# Patient Record
Sex: Female | Born: 1990 | Race: Black or African American | Hispanic: No | Marital: Single
Health system: Midwestern US, Community
[De-identification: ages and names within clinical notes are randomized; demographics above are authoritative.]

## PROBLEM LIST (undated history)

## (undated) DIAGNOSIS — N643 Galactorrhea not associated with childbirth: Secondary | ICD-10-CM

---

## 2015-07-02 IMAGING — US US NON OB TRANSVAGINAL W LTD TA
1 series · 14 of 28 positions shown · non-contrast
Comparison: 07/15/2008, which was an OB study.

HISTORY: Pelvic pain. 24-year-old. LMP 09/25/2014.
TECHNIQUE: Pelvic ultrasound. This study is performed transvaginal and transabdominal.

[Series 1: us non ob transvaginal w ltd ta · 14 of 55 slices shown]
[im 3/55]
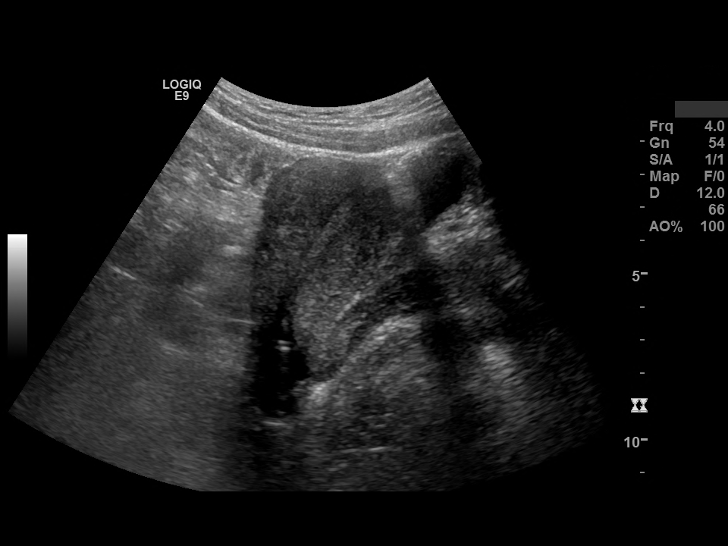
[im 7/55]
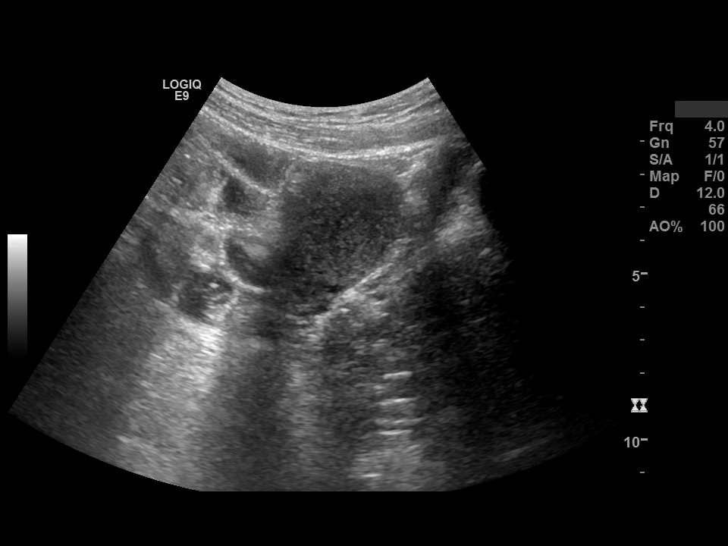
[im 11/55]
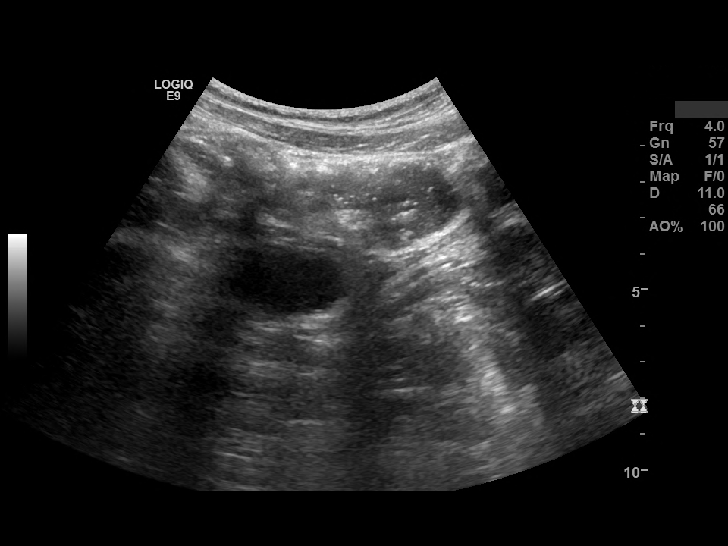
[im 15/55]
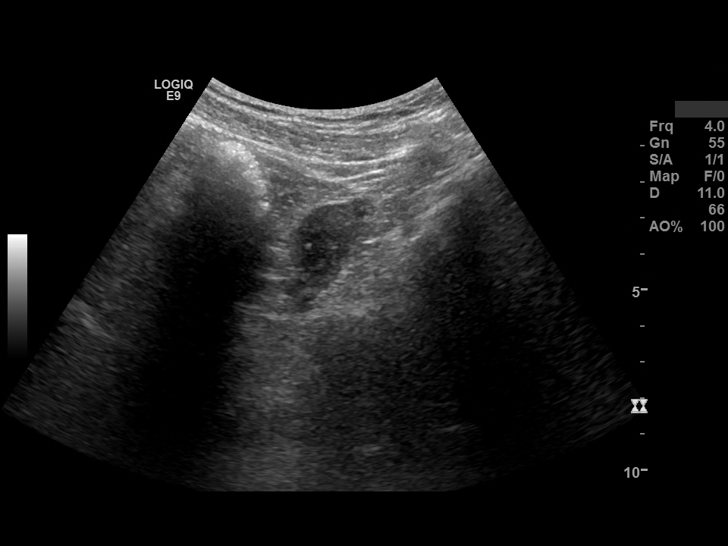
[im 19/55]
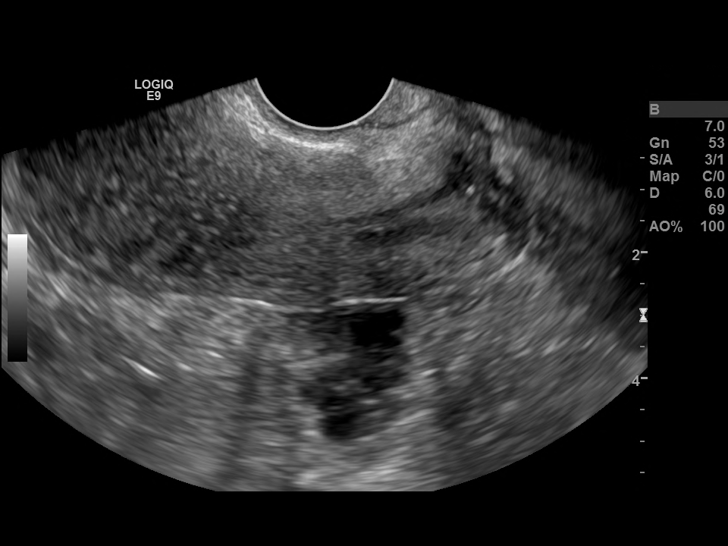
[im 23/55]
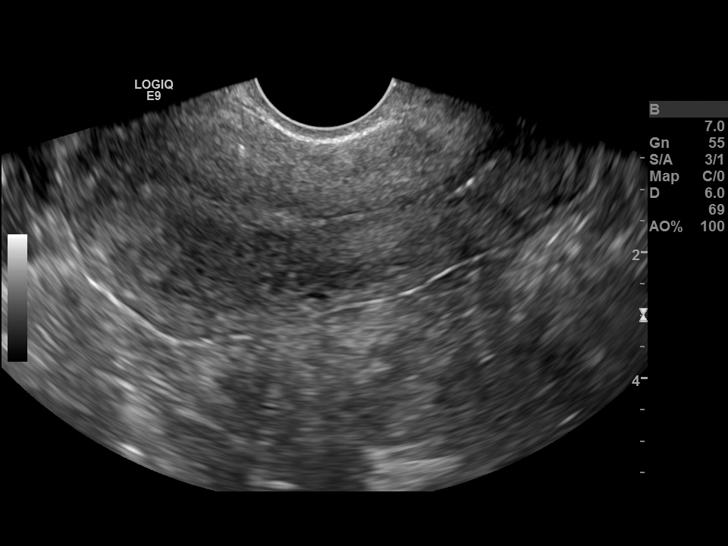
[im 27/55]
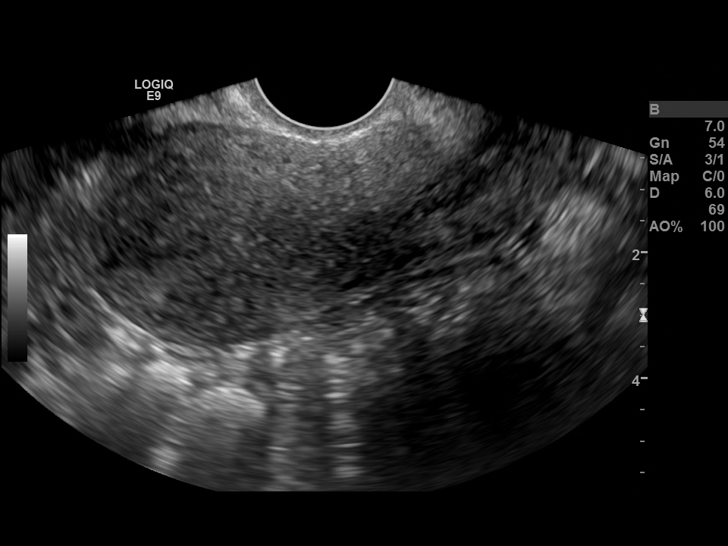
[im 31/55]
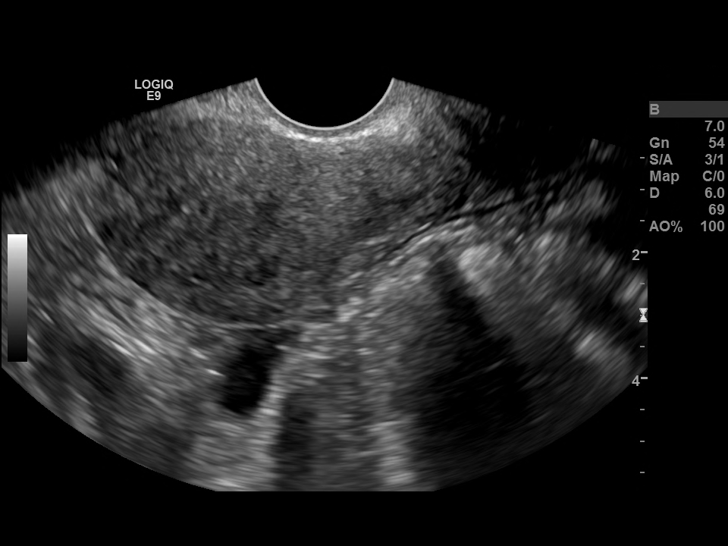
[im 35/55]
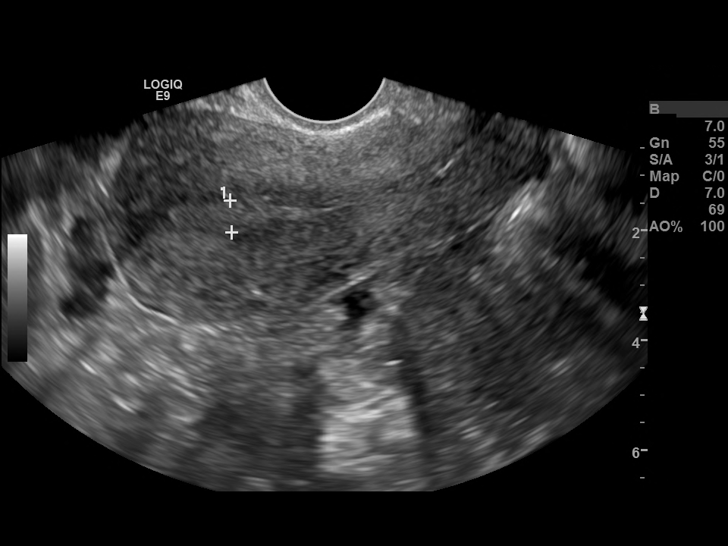
[im 39/55]
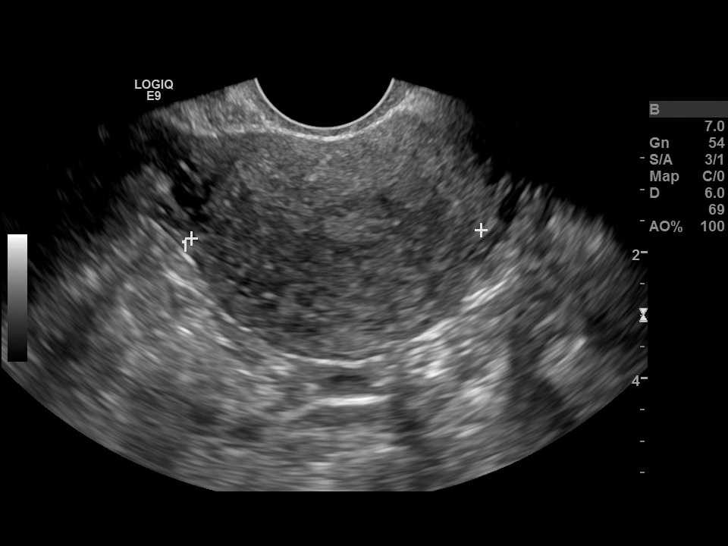
[im 43/55]
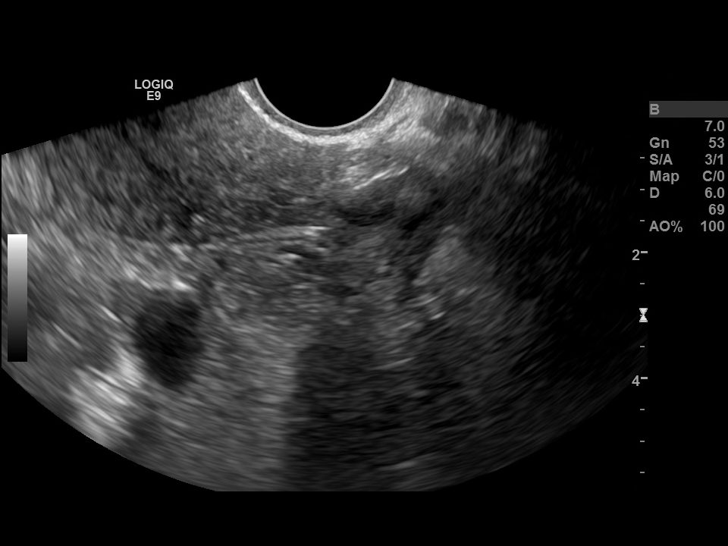
[im 47/55]
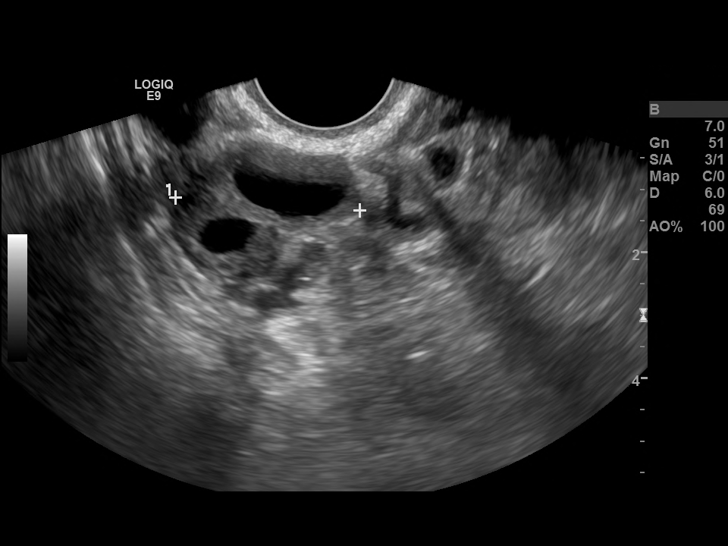
[im 51/55]
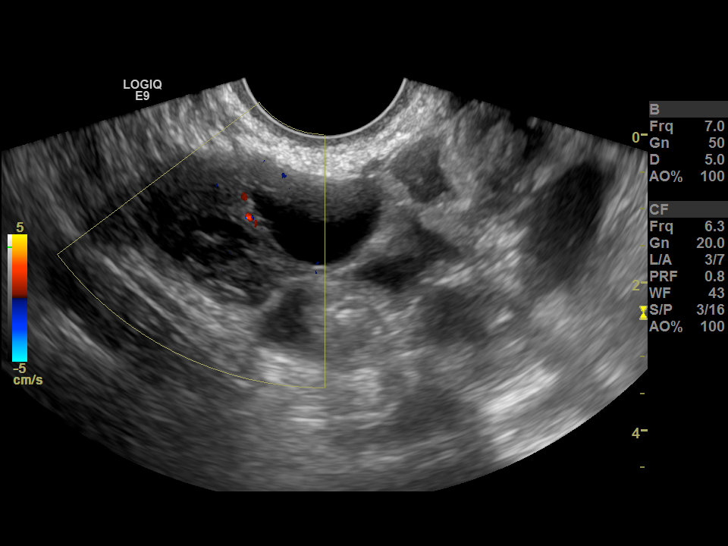
[im 55/55]
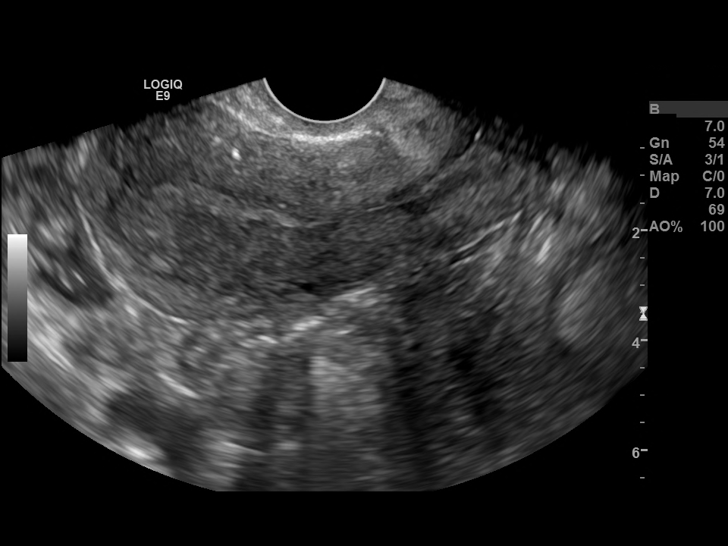

[14 of 28 positions shown; findings below may reference images not displayed]

FINDINGS: The uterus measures 82 x 38 x 46 mm. There is a 6 mm thick endometrium. No uterine lesions such as fibroids are seen.

Right ovary is 36 x 22 x 29 mm with a follicular cyst of 19 x 11 x 19 mm.

Left ovary is 27 x 22 x 18 mm and appears normal. Follicles are seen bilaterally, as well.

No free fluid is seen.
IMPRESSION: 1. Normal appearing uterus and endometrium.

2. Follicular cysts on the right, several small follicles bilaterally.

## 2023-02-15 ENCOUNTER — Inpatient Hospital Stay
Admit: 2023-02-15 | Discharge: 2023-02-16 | Disposition: A | Discharge status: Home / Self Care | Payer: PRIVATE HEALTH INSURANCE | Attending: Emergency Medicine

## 2023-02-15 DIAGNOSIS — L301 Dyshidrosis [pompholyx]: Secondary | ICD-10-CM

## 2023-02-15 NOTE — ED Provider Notes (Signed)
EMERGENCY DEPARTMENT HISTORY AND PHYSICAL EXAM    Date: 02/15/2023  Patient Name: Gina Becker    History of Presenting Illness     Chief Complaint   Patient presents with    Rash         History Provided By: patient     Chief Complaint: rash   Duration: 2 weeks  Timing:  acute  Location: hands feet ankles   Quality: itchy   Severity:moderate to severe  Modifying Factors: none   Associated Symptoms: none      Additional History (Context): Gina Becker is a 32 y.o. female with no documented PMH who presents with c/o a rash to the hands, arms, ankles and legs bilaterally. Pt has a hx of eczema which she states she typically treats wit eucerin cream. Pt states she has been using her normal hygiene regimen with no improvement in her sx. Pt denies any known trigger aside from stress. Denies prodromal sx and no one at home has a rash besides her. No other complaints reported at this time.     PCP: No primary care provider on file.    No current facility-administered medications for this encounter.     Current Outpatient Medications   Medication Sig Dispense Refill    predniSONE 5 MG (21) TBPK Use as directed 21 each 0    triamcinolone (KENALOG) 0.1 % cream Apply topically 2 times daily. 453.6 g 0       Past History     Past Medical History:  No past medical history on file.    Past Surgical History:  No past surgical history on file.    Family History:  No family history on file.    Social History:       Allergies:  No Known Allergies      Review of Systems   Review of Systems   Constitutional:  Negative for chills and fever.   HENT:  Negative for congestion, rhinorrhea and sore throat.    Eyes:  Negative for discharge and redness.   Respiratory:  Negative for cough, shortness of breath, wheezing and stridor.    Cardiovascular:  Negative for chest pain.   Gastrointestinal:  Negative for abdominal pain, nausea and vomiting.   Genitourinary:  Negative for dysuria and frequency.   Musculoskeletal:  Negative for back pain  and neck pain.   Skin:  Positive for rash. Negative for wound.   Neurological:  Negative for seizures, syncope and headaches.   All other systems reviewed and are negative.    All Other Systems Negative  Physical Exam     Vitals:    02/15/23 2008   BP: 117/81   Pulse: 76   Resp: 18   Temp: 97.9 F (36.6 C)   SpO2: 98%   Weight: 99.8 kg (220 lb)   Height: 1.727 m (5\' 8" )     Physical Exam  Vitals and nursing note reviewed.   Constitutional:       General: She is not in acute distress.     Appearance: Normal appearance. She is not ill-appearing, toxic-appearing or diaphoretic.   HENT:      Head: Normocephalic and atraumatic.      Mouth/Throat:      Mouth: Mucous membranes are moist.   Eyes:      General: No scleral icterus.        Right eye: No discharge.         Left eye: No discharge.  Conjunctiva/sclera: Conjunctivae normal.   Cardiovascular:      Rate and Rhythm: Normal rate.   Pulmonary:      Effort: Pulmonary effort is normal. No respiratory distress.      Breath sounds: No stridor.   Musculoskeletal:         General: No deformity or signs of injury. Normal range of motion.      Cervical back: Normal range of motion and neck supple.   Skin:     General: Skin is warm and dry.      Findings: Rash present.      Comments: Scaling papular rash with skin peeling and scabbing noted to the hands, both dorsal and palmar surfaces, arms, ankles, and legs. No excoriation to the web spaces of the fingers.    Neurological:      General: No focal deficit present.      Mental Status: She is alert and oriented to person, place, and time. Mental status is at baseline.      Motor: No weakness.      Coordination: Coordination normal.      Comments: Gait is steady and patient exhibits no evidence of ataxia. Patient is able to ambulate without difficulty. No focal neurological deficit noted. No facial droop, slurred speech, or evidence of altered mentation noted on exam.     Psychiatric:         Mood and Affect: Mood normal.          Behavior: Behavior normal.         Thought Content: Thought content normal.                Diagnostic Study Results     Labs -   No results found for this or any previous visit (from the past 12 hour(s)).    Radiologic Studies -   No orders to display     @CT48 @  @CXR48 @      Medical Decision Making   I am the first provider for this patient.    I reviewed the vital signs, available nursing notes, past medical history, past surgical history, family history and social history.    Vital Signs-Reviewed the patient's vital signs.      Records Reviewed: Shara Hartis, PA-C     Procedures:  Procedures    Provider Notes (Medical Decision Making): Impression:  rash, dishydrotic eczema     Clinical presentation suggestive of dishydrotic eczema, will plan to d/c with topical triamcinolone and oral prednisone, with dermatology follow-up. Pt agrees. Damean Poffenberger J Kimbrely Buckel, PA-C      Dictation disclaimer:  Please note that this dictation was completed with Dragon, the computer voice recognition software.  Quite often unanticipated grammatical, syntax, homophones, and other interpretive errors are inadvertently transcribed by the computer software.  Please disregard these errors.  Please excuse any errors that have escaped final proofreading.      MED RECONCILIATION:  No current facility-administered medications for this encounter.     Current Outpatient Medications   Medication Sig    predniSONE 5 MG (21) TBPK Use as directed    triamcinolone (KENALOG) 0.1 % cream Apply topically 2 times daily.       Disposition:  D/c        Medication List        START taking these medications      predniSONE 5 MG (21) Tbpk  Use as directed     triamcinolone 0.1 % cream  Commonly known as: KENALOG  Apply  topically 2 times daily.               Where to Get Your Medications        These medications were sent to [id number] - [place], [place] [facility] [address] - P [phone] Carmon Ginsberg [phone]  [address] [place], [place] [address]       Phone: [phone]   predniSONE 5 MG (21) Tbpk  triamcinolone 0.1 % cream           Diagnosis     Clinical Impression:   1. Dyshidrotic eczema                Myrle Dues J, PA-C  02/15/23 2105

## 2023-02-15 NOTE — ED Triage Notes (Signed)
Patient complains of having a rash on her arms , legs and feet x 2 weeks. Patient has a hx of eczema.

## 2023-02-16 MED ORDER — PREDNISONE 20 MG PO TABS
20 | ORAL | Status: AC
Start: 2023-02-16 — End: 2023-02-15
  Administered 2023-02-16: 01:00:00 60 mg via ORAL

## 2023-02-16 MED ORDER — TRIAMCINOLONE ACETONIDE 0.1 % EX CREA
0.1 | CUTANEOUS | 0 refills | 26.00000 days | Status: DC
Start: 2023-02-16 — End: 2024-04-17

## 2023-02-16 MED ORDER — PREDNISONE 5 MG (21) PO TBPK
5 | ORAL | 0 refills | 6.00000 days | Status: DC
Start: 2023-02-16 — End: 2024-04-17

## 2023-02-16 MED FILL — PREDNISONE 20 MG PO TABS: 20 MG | ORAL | Qty: 3

## 2023-10-01 LAB — HEPATITIS B, EXTERNAL RESULT: Hep B, External Result: NONREACTIVE

## 2023-10-01 LAB — HIV, EXTERNAL RESULT: HIV, External Result: NONREACTIVE

## 2023-10-01 LAB — N. GONORRHOEAE, EXTERNAL RESULT: N. Gonorrhoeae, External Result: NEGATIVE

## 2023-10-01 LAB — C. TRACHOMATIS, EXTERNAL RESULT: C. Trachomatis, External Result: NEGATIVE

## 2023-10-01 LAB — RPR, EXTERNAL RESULT: RPR, External Result: NONREACTIVE

## 2023-10-01 LAB — RUBELLA TITER, EXTERNAL RESULT: Rubella Titer, External Result: IMMUNE

## 2023-11-06 ENCOUNTER — Encounter

## 2023-11-11 ENCOUNTER — Inpatient Hospital Stay: Admit: 2023-11-11 | Payer: BLUE CROSS/BLUE SHIELD

## 2023-11-11 DIAGNOSIS — N643 Galactorrhea not associated with childbirth: Secondary | ICD-10-CM

## 2024-03-18 LAB — GBS, EXTERNAL RESULT: GBS, External Result: POSITIVE

## 2024-04-13 ENCOUNTER — Inpatient Hospital Stay
Admit: 2024-04-13 | Discharge: 2024-04-17 | Disposition: A | Discharge status: Home / Self Care | Payer: BLUE CROSS/BLUE SHIELD | Attending: Obstetrics & Gynecology | Admitting: Obstetrics & Gynecology

## 2024-04-13 DIAGNOSIS — O480 Post-term pregnancy: Secondary | ICD-10-CM

## 2024-04-13 DIAGNOSIS — O329XX0 Maternal care for malpresentation of fetus, unspecified, not applicable or unspecified: Secondary | ICD-10-CM

## 2024-04-13 LAB — ABO/RH: ABO/Rh: A POS

## 2024-04-13 LAB — COMPREHENSIVE METABOLIC PANEL
ALT: <7 U/L — ABNORMAL LOW (ref 10–49)
AST: 15 U/L (ref 0.0–33.9)
Albumin: 2.8 g/dL — ABNORMAL LOW (ref 3.4–5.0)
Alkaline Phosphatase: 192 U/L — ABNORMAL HIGH (ref 46–116)
Anion Gap: 12 mmol/L (ref 5–15)
BUN: 8 mg/dL — ABNORMAL LOW (ref 9–23)
CO2: 24 meq/L (ref 20–31)
Calcium: 9.4 mg/dL (ref 8.7–10.4)
Chloride: 104 meq/L (ref 98–107)
Creatinine: 0.65 mg/dL (ref 0.55–1.02)
GFR African American: >60
GFR Non-African American: >60
Glucose: 121 mg/dL — ABNORMAL HIGH (ref 74–106)
Potassium: 3.8 meq/L (ref 3.5–5.1)
Sodium: 140 meq/L (ref 136–145)
Total Bilirubin: 0.3 mg/dL (ref 0.30–1.20)
Total Protein: 6.8 g/dL (ref 5.7–8.2)

## 2024-04-13 LAB — CBC WITH AUTO DIFFERENTIAL
Basophils: 0.3 % (ref 0–3)
Eosinophils: 1.3 % (ref 0–5)
Hematocrit: 34.1 % — ABNORMAL LOW (ref 35.0–47.0)
Hemoglobin: 11.1 g/dL (ref 11.0–16.0)
Immature Granulocytes %: 0.6 % (ref 0.0–3.0)
Lymphocytes: 16 % — ABNORMAL LOW (ref 28–48)
MCH: 27.2 pg (ref 25.4–34.6)
MCHC: 32.6 g/dL (ref 30.0–36.0)
MCV: 83.6 fL (ref 80.0–98.0)
MPV: 12.4 fL — ABNORMAL HIGH (ref 6.0–10.0)
Monocytes: 7 % (ref 1–13)
Neutrophils Segmented: 74.8 % — ABNORMAL HIGH (ref 34–64)
Nucleated RBCs: 0 (ref 0–0)
Platelets: 205 1000/mm3 (ref 140–450)
RBC: 4.08 M/uL (ref 3.60–5.20)
RDW: 50.6 — ABNORMAL HIGH (ref 36.4–46.3)
WBC: 11.9 1000/mm3 — ABNORMAL HIGH (ref 4.0–11.0)

## 2024-04-13 LAB — LACTATE DEHYDROGENASE: LD: 153 U/L (ref 120–246)

## 2024-04-13 LAB — PROTEIN / CREATININE RATIO, URINE
Creatinine, Random Urine: 82.6 mg/dL (ref 30.0–125.0)
Protein, Urine, Random: 9.8 mg/dL (ref 1.0–14.0)
Urine Total Protein Creatinine Ratio: 0.12 g/(24.h) (ref 0.00–0.19)

## 2024-04-13 LAB — ABO/RH RETYPE: ABO/RH Retype: A POS

## 2024-04-13 LAB — ANTIBODY SCREEN: Antibody Screen: NEGATIVE

## 2024-04-13 NOTE — Procedures (Cosign Needed)
 NST Procedure Note    Patient: Gina Becker               Sex: female          DOA: 04/13/2024       Date of Birth:  1990-08-31      Age:  33 y.o.        LOS:  LOS: 0 days     MRN: [id number]                    CSN: [id number]      Estimated Gestational Age:[redacted]w[redacted]d     Indication for NST: Polyhydramnios     Primary OB:  Eva Shine, DO    Fetal Vital Signs:  Mode: External  Fetal Heart Rate:130  Variability:Moderate 6-25 bpm  Decelerations: Yes  Accelerations:{Yes / No :Yes  FHR Interpretations:Category II    Uterine Activity:  Mode: External  Frequency : irregular     Notified RN to notify MD  Provided NST Interpretation only; assessment and care of patient via RN and primary MD    Signed ab:[id number] S Gretta Samons, APRN - CNM  04/13/2024 2:54 PM

## 2024-04-13 NOTE — Progress Notes (Addendum)
 3 SBAR received from [employer] RN. Prenatal reviewed.    2216 Paged Dr. Elnor II to ask for pain medication  2302 Paged Dr. Elnor II  2311 Paged Dr. Eva. Orders received for Nubain .    0715- SBAR given to MARLA Laine RN. Prenatal reviewed.

## 2024-04-13 NOTE — Plan of Care (Signed)
 Problem: Vaginal Birth or Cesarean Section  Goal: Fetal and maternal status remain reassuring during the birth process  Description:  Birth OB-Pregnancy care plan goal which identifies if the fetal and maternal status remain reassuring during the birth process  Outcome: Progressing     Problem: Pain  Goal: Verbalizes/displays adequate comfort level or baseline comfort level  Outcome: Progressing     Problem: Infection - Adult  Goal: Absence of infection at discharge  Outcome: Progressing  Goal: Absence of infection during hospitalization  Outcome: Progressing  Goal: Absence of fever/infection during anticipated neutropenic period  Outcome: Progressing     Problem: Safety - Adult  Goal: Free from fall injury  Outcome: Progressing     Problem: Discharge Planning  Goal: Discharge to home or other facility with appropriate resources  Outcome: Progressing     Problem: Chronic Conditions and Co-morbidities  Goal: Patient's chronic conditions and co-morbidity symptoms are monitored and maintained or improved  Outcome: Progressing

## 2024-04-13 NOTE — Progress Notes (Addendum)
 1423: [redacted]w[redacted]d G2P0010 patient arrives to Antepartum for NST from office. Patient endorses fetal movement. Denies HA, blurry vision, LOF, or bleeding.     1528: Dr. Eva at bedside, SVE performed, patient fingertip, MD unable to feel presenting part, Ultrasound confirmed breech presentation. MD to schedule c-section for tomorrow. Plan to keep patient overnight, Patient can eat, NPO at midnight.     1925: SBAR given to IVAR Minus, RN

## 2024-04-13 NOTE — H&P (Signed)
 History & Physical    Name: Gina Becker MRN: [id number]  SSN: [id number]    Date of Birth: [birth year]  Age: 33 y.o.  Sex: female      Estimated Date of Delivery: 04/12/24  OB History       Gravida   2    Para   0    Term   0    Preterm   0    AB   1    Living   0         SAB   1    IAB   0    Ectopic   0    Molar   0    Multiple   0    Live Births   0                Gina Becker is a 33 yo G2P0010 presented with pregnancy at [redacted]w[redacted]d from the office after a non-reassuring NST.     +irregular ctx and vaginal spotting.  Her prenatal course was complicated by LGA >99%on last scan, polyhydramnios and breech presentation at 36 wks, cephalic at 37 weeks.   Please see prenatal records for details.    Past Medical History:   Diagnosis Date    Galactorrhea not associated with childbirth     Macrosomia     Polyhydramnios      History reviewed. No pertinent surgical history.  Family hx:  noncontributory  Social History     Occupational History    Not on file   Tobacco Use    Smoking status: Never    Smokeless tobacco: Never   Vaping Use    Vaping status: Never Used   Substance and Sexual Activity    Alcohol use: Never    Drug use: Never    Sexual activity: Yes     Partners: Male     History reviewed. No pertinent family history.    No Known Allergies  Prior to Admission medications   Medication Sig Start Date End Date Taking? Authorizing Provider   Prenatal Multivit-Min-Fe-FA (PRENATAL 1 + IRON  PO) Take by mouth   Yes [provider]   predniSONE  5 MG (21) TBPK Use as directed  Patient not taking: Reported on 04/13/2024 02/15/23   Harvill, April J, PA-C   triamcinolone  (KENALOG ) 0.1 % cream Apply topically 2 times daily.  Patient not taking: Reported on 04/13/2024 02/15/23   Lenoard, April J, [place]        Review of Systems: Pertinent items are noted in HPI.    Objective:     Vitals:  Vitals:    04/13/24 1424   Weight: 126.1 kg (278 lb)   Height: 1.727 m (5' 8)        Physical Exam:  Gen:  NAD  Chest:  Normal respiratory effort  without use of accessory muscles.  Abdomen:  Gravid, NT  Pelvic:  cervix FT, thick, high.  No palpable presenting part.  Membranes intact.  Bedside US  confirmed breech presentation with vertex in maternal RUQ.  FHT 130 mod variability, accels present, no decels.  Toco:  random ctx    No results found for this or any previous visit (from the past 12 hours).    Prenatal Labs:   No components found for: OBEXTABORH, OBEXTABSCRN, OBEXTRUBELLA, OBEXTGRBS, OBEXTHBSAG, OBEXTHIV, OBEXTRPR, OBEXTGONORR, OBEXTCHLAM      Assessment/Plan:     Patient Active Problem List   Diagnosis    Post-term pregnancy, 40-42 weeks  of gestation    Gestational hypertension, third trimester    Breech presentation    Polyhydramnios in third trimester    Macrosomia of fetus affecting management of mother in third trimester   PIH labs ordered.  Preeclampsia management reviewed.  Patient declined ECV.  Proceed with primary LTCS, scheduled for 1330 tomorrow unless other maternal/fetal indications arise.  NPO after MN advised.  Surgical risks including but not limited to bleeding, infection, injury to nearby structures and DVT discussed.  Postop care/recovery reviewed.  All questions answered to her satisfaction.          Signed By:  Allessandra Bernardi, DO     April 13, 2024

## 2024-04-14 ENCOUNTER — Ambulatory Visit: Payer: BLUE CROSS/BLUE SHIELD

## 2024-04-14 DIAGNOSIS — O329XX0 Maternal care for malpresentation of fetus, unspecified, not applicable or unspecified: Principal | ICD-10-CM

## 2024-04-14 LAB — RPR W/REFLEX TITER AND CONFIRMATION: RPR: NONREACTIVE

## 2024-04-14 MED ORDER — FAMOTIDINE (PF) 20 MG/2ML IV SOLN
20 | Freq: Once | INTRAVENOUS | Status: DC
Start: 2024-04-14 — End: 2024-04-14

## 2024-04-14 MED ORDER — CEFAZOLIN 3000 MG IN NS 100 ML IVPB
Freq: Once | Status: AC
Start: 2024-04-14 — End: 2024-04-14
  Administered 2024-04-14: 19:00:00 3000 mg via INTRAVENOUS

## 2024-04-14 MED ORDER — SODIUM CHLORIDE 0.9 % IV SOLN
0.9 | INTRAVENOUS | Status: DC | PRN
Start: 2024-04-14 — End: 2024-04-17

## 2024-04-14 MED ORDER — HYDROMORPHONE HCL 1 MG/ML IJ SOLN
1 | INTRAMUSCULAR | Status: DC | PRN
Start: 2024-04-14 — End: 2024-04-17

## 2024-04-14 MED ORDER — ACETAMINOPHEN 325 MG PO TABS
325 | Freq: Once | ORAL | Status: DC
Start: 2024-04-14 — End: 2024-04-14

## 2024-04-14 MED ORDER — ONDANSETRON 4 MG PO TBDP
4 | Freq: Three times a day (TID) | ORAL | Status: DC | PRN
Start: 2024-04-14 — End: 2024-04-15

## 2024-04-14 MED ORDER — FERROUS SULFATE 325 (65 FE) MG PO TABS
325 | ORAL | Status: DC
Start: 2024-04-14 — End: 2024-04-17
  Administered 2024-04-15 – 2024-04-17 (×2): 325 mg via ORAL

## 2024-04-14 MED ORDER — ACETAMINOPHEN 500 MG PO TABS
500 | Freq: Three times a day (TID) | ORAL | Status: DC
Start: 2024-04-14 — End: 2024-04-15
  Administered 2024-04-15: 06:00:00 1000 mg via ORAL

## 2024-04-14 MED ORDER — LACTATED RINGERS IV SOLN
INTRAVENOUS | Status: DC | PRN
Start: 2024-04-14 — End: 2024-04-14
  Administered 2024-04-14: 19:00:00 via INTRAVENOUS

## 2024-04-14 MED ORDER — FENTANYL CITRATE (PF) 100 MCG/2ML IJ SOLN
100 | INTRAMUSCULAR | Status: DC | PRN
Start: 2024-04-14 — End: 2024-04-14

## 2024-04-14 MED ORDER — SODIUM CHLORIDE 0.9 % IV SOLN
0.9 | INTRAVENOUS | Status: DC | PRN
Start: 2024-04-14 — End: 2024-04-14
  Administered 2024-04-14: 19:00:00 20 via INTRAVENOUS

## 2024-04-14 MED ORDER — LACTATED RINGERS IV SOLN
INTRAVENOUS | Status: DC
Start: 2024-04-14 — End: 2024-04-14
  Administered 2024-04-14: 11:00:00 via INTRAVENOUS

## 2024-04-14 MED ORDER — SODIUM CHLORIDE (PF) 0.9 % IJ SOLN
0.9 | Freq: Once | INTRAMUSCULAR | Status: AC
Start: 2024-04-14 — End: 2024-04-14
  Administered 2024-04-14: 17:00:00 20 mg via INTRAVENOUS

## 2024-04-14 MED ORDER — DEXAMETHASONE SODIUM PHOSPHATE 4 MG/ML IJ SOLN
4 | Freq: Once | INTRAMUSCULAR | Status: DC | PRN
Start: 2024-04-14 — End: 2024-04-14
  Administered 2024-04-14: 19:00:00 10 via INTRAVENOUS

## 2024-04-14 MED ORDER — NORMAL SALINE FLUSH 0.9 % IV SOLN
0.9 | INTRAVENOUS | Status: DC | PRN
Start: 2024-04-14 — End: 2024-04-15

## 2024-04-14 MED ORDER — LANSINOH LANOLIN EX CREA
CUTANEOUS | Status: DC | PRN
Start: 2024-04-14 — End: 2024-04-17

## 2024-04-14 MED ORDER — ONDANSETRON HCL 4 MG/2ML IJ SOLN
4 | Freq: Once | INTRAMUSCULAR | Status: DC | PRN
Start: 2024-04-14 — End: 2024-04-14
  Administered 2024-04-14: 19:00:00 8 via INTRAVENOUS

## 2024-04-14 MED ORDER — PRENATAL-U 106.5-1 MG PO CAPS
106.5-1 | Freq: Every day | ORAL | Status: DC
Start: 2024-04-14 — End: 2024-04-17
  Administered 2024-04-15 – 2024-04-17 (×3): 1 via ORAL

## 2024-04-14 MED ORDER — SODIUM CHLORIDE 0.9 % IV SOLN
0.9 | INTRAVENOUS | Status: DC | PRN
Start: 2024-04-14 — End: 2024-04-14

## 2024-04-14 MED ORDER — LACTATED RINGERS IV SOLN
INTRAVENOUS | Status: DC
Start: 2024-04-14 — End: 2024-04-15

## 2024-04-14 MED ORDER — OXYCODONE HCL 5 MG PO TABS
5 | ORAL | Status: AC | PRN
Start: 2024-04-14 — End: 2024-04-15
  Administered 2024-04-15: 19:00:00 5 mg via ORAL

## 2024-04-14 MED ORDER — MISOPROSTOL 200 MCG PO TABS
200 | ORAL | Status: DC | PRN
Start: 2024-04-14 — End: 2024-04-17

## 2024-04-14 MED ORDER — ACETAMINOPHEN 10 MG/ML IV SOLN
10 | Freq: Once | INTRAVENOUS | Status: DC | PRN
Start: 2024-04-14 — End: 2024-04-14
  Administered 2024-04-14: 20:00:00 1000 via INTRAVENOUS

## 2024-04-14 MED ORDER — SODIUM CHLORIDE 0.9 % IV SOLN (MINI-BAG)
0.9 | Freq: Once | INTRAVENOUS | Status: DC
Start: 2024-04-14 — End: 2024-04-14

## 2024-04-14 MED ORDER — PHENYLEPHRINE HCL 10 MG/ML SOLN (MIXTURES ONLY)
10 | Freq: Once | Status: DC | PRN
Start: 2024-04-14 — End: 2024-04-14
  Administered 2024-04-14: 19:00:00 100 via INTRAVENOUS

## 2024-04-14 MED ORDER — NALBUPHINE HCL 10 MG/ML IJ SOLN
10 | INTRAMUSCULAR | Status: DC | PRN
Start: 2024-04-14 — End: 2024-04-14
  Administered 2024-04-14: 04:00:00 5 mg via INTRAVENOUS

## 2024-04-14 MED ORDER — LACTATED RINGERS IV SOLN
INTRAVENOUS | Status: DC
Start: 2024-04-14 — End: 2024-04-14

## 2024-04-14 MED ORDER — NORMAL SALINE FLUSH 0.9 % IV SOLN
0.9 | Freq: Two times a day (BID) | INTRAVENOUS | Status: DC
Start: 2024-04-14 — End: 2024-04-14

## 2024-04-14 MED ORDER — PENICILLIN G POTASSIUM IN NS 2500000 UNIT/100 ML IVPB
2500000 | INTRAVENOUS | Status: DC
Start: 2024-04-14 — End: 2024-04-14

## 2024-04-14 MED ORDER — MORPHINE SULFATE (PF) 1 MG/ML IJ SOLN
1 | INTRAMUSCULAR | Status: AC
Start: 2024-04-14 — End: 2024-04-14

## 2024-04-14 MED ORDER — OXYTOCIN 10 UNIT/ML IJ SOLN
10 | INTRAMUSCULAR | Status: DC | PRN
Start: 2024-04-14 — End: 2024-04-14
  Administered 2024-04-14 (×2): 10 via INTRAVENOUS

## 2024-04-14 MED ORDER — MEPERIDINE HCL 25 MG/ML IJ SOLN
25 | Freq: Once | INTRAMUSCULAR | Status: AC
Start: 2024-04-14 — End: 2024-04-14
  Administered 2024-04-14: 21:00:00 12.5 mg via INTRAVENOUS

## 2024-04-14 MED ORDER — ONDANSETRON HCL 4 MG/2ML IJ SOLN
4 | Freq: Once | INTRAMUSCULAR | Status: DC | PRN
Start: 2024-04-14 — End: 2024-04-14

## 2024-04-14 MED ORDER — NORMAL SALINE FLUSH 0.9 % IV SOLN
0.9 | INTRAVENOUS | Status: DC | PRN
Start: 2024-04-14 — End: 2024-04-14

## 2024-04-14 MED ORDER — OXYTOCIN 30 UNITS IN 500 ML INFUSION
30 | INTRAVENOUS | Status: DC | PRN
Start: 2024-04-14 — End: 2024-04-14

## 2024-04-14 MED ORDER — LOPERAMIDE HCL 2 MG PO CAPS
2 | ORAL | Status: DC | PRN
Start: 2024-04-14 — End: 2024-04-17

## 2024-04-14 MED ORDER — MORPHINE SULFATE (PF) 0.5 MG/ML IJ SOLN
0.5 | INTRAMUSCULAR | Status: AC
Start: 2024-04-14 — End: 2024-04-14

## 2024-04-14 MED ORDER — METHYLERGONOVINE MALEATE 0.2 MG/ML IJ SOLN
0.2 | INTRAMUSCULAR | Status: DC | PRN
Start: 2024-04-14 — End: 2024-04-14

## 2024-04-14 MED ORDER — ONDANSETRON HCL 4 MG/2ML IJ SOLN
4 | Freq: Four times a day (QID) | INTRAMUSCULAR | Status: DC | PRN
Start: 2024-04-14 — End: 2024-04-15

## 2024-04-14 MED ORDER — ONDANSETRON HCL 4 MG/2ML IJ SOLN
4 | Freq: Four times a day (QID) | INTRAMUSCULAR | Status: DC | PRN
Start: 2024-04-14 — End: 2024-04-14

## 2024-04-14 MED ORDER — OXYTOCIN 0.06 UNIT/ML BOLUS FROM THE BAG (POST-PARTUM)
30 | INTRAVENOUS | Status: DC | PRN
Start: 2024-04-14 — End: 2024-04-14

## 2024-04-14 MED ORDER — TERBUTALINE SULFATE 1 MG/ML IJ SOLN
1 | Freq: Once | INTRAMUSCULAR | Status: DC | PRN
Start: 2024-04-14 — End: 2024-04-17

## 2024-04-14 MED ORDER — CARBOPROST TROMETHAMINE 250 MCG/ML IM SOLN
250 | INTRAMUSCULAR | Status: DC | PRN
Start: 2024-04-14 — End: 2024-04-15

## 2024-04-14 MED ORDER — DIPHENHYDRAMINE HCL 50 MG/ML IJ SOLN
50 | Freq: Four times a day (QID) | INTRAMUSCULAR | Status: DC | PRN
Start: 2024-04-14 — End: 2024-04-17

## 2024-04-14 MED ORDER — TETANUS-DIPHTH-ACELL PERTUSSIS 5-2.5-18.5 LF-MCG/0.5 IM SUSY
5-2.5-18.5 | INTRAMUSCULAR | Status: DC
Start: 2024-04-14 — End: 2024-04-17

## 2024-04-14 MED ORDER — FENTANYL CITRATE (PF) 100 MCG/2ML IJ SOLN
100 | INTRAMUSCULAR | Status: AC
Start: 2024-04-14 — End: 2024-04-14

## 2024-04-14 MED ORDER — CEFAZOLIN 3000 MG IN NS 100 ML IVPB
Freq: Once | Status: DC
Start: 2024-04-14 — End: 2024-04-14

## 2024-04-14 MED ORDER — MORPHINE SULFATE (PF) 0.5 MG/ML IJ SOLN
0.5 | Freq: Once | INTRAMUSCULAR | Status: AC | PRN
Start: 2024-04-14 — End: 2024-04-14
  Administered 2024-04-14: 19:00:00 .15 via INTRATHECAL

## 2024-04-14 MED ORDER — MISOPROSTOL 200 MCG PO TABS
200 | ORAL | Status: DC | PRN
Start: 2024-04-14 — End: 2024-04-14

## 2024-04-14 MED ORDER — FENTANYL CITRATE (PF) 100 MCG/2ML IJ SOLN
100 | Freq: Once | INTRAMUSCULAR | Status: AC | PRN
Start: 2024-04-14 — End: 2024-04-14
  Administered 2024-04-14: 19:00:00 10 via INTRASPINAL

## 2024-04-14 MED ORDER — KETOROLAC TROMETHAMINE 30 MG/ML IJ SOLN
30 | Freq: Once | INTRAMUSCULAR | Status: DC | PRN
Start: 2024-04-14 — End: 2024-04-14
  Administered 2024-04-14: 20:00:00 15 via INTRAVENOUS

## 2024-04-14 MED ORDER — LACTATED RINGERS IV BOLUS
Freq: Once | INTRAVENOUS | Status: AC
Start: 2024-04-14 — End: 2024-04-14
  Administered 2024-04-14: 17:00:00 1000 mL via INTRAVENOUS

## 2024-04-14 MED ORDER — LACTATED RINGERS IV SOLN
INTRAVENOUS | Status: DC
Start: 2024-04-14 — End: 2024-04-15
  Administered 2024-04-14: 23:00:00 via INTRAVENOUS

## 2024-04-14 MED ORDER — ACETAMINOPHEN 500 MG PO TABS
500 | Freq: Four times a day (QID) | ORAL | Status: AC | PRN
Start: 2024-04-14 — End: 2024-04-15

## 2024-04-14 MED ORDER — KETOROLAC TROMETHAMINE 30 MG/ML IJ SOLN
30 | Freq: Once | INTRAMUSCULAR | Status: DC
Start: 2024-04-14 — End: 2024-04-14

## 2024-04-14 MED ORDER — SODIUM CHLORIDE 0.9 % IV SOLN (MINI-BAG)
0.9 | Freq: Once | INTRAVENOUS | Status: DC | PRN
Start: 2024-04-14 — End: 2024-04-17

## 2024-04-14 MED ORDER — ONDANSETRON HCL 4 MG/2ML IJ SOLN
4 | Freq: Four times a day (QID) | INTRAMUSCULAR | Status: DC | PRN
Start: 2024-04-14 — End: 2024-04-17

## 2024-04-14 MED ORDER — CARBOPROST TROMETHAMINE 250 MCG/ML IM SOLN
250 | INTRAMUSCULAR | Status: DC | PRN
Start: 2024-04-14 — End: 2024-04-17

## 2024-04-14 MED ORDER — LACTATED RINGERS IV BOLUS
INTRAVENOUS | Status: DC | PRN
Start: 2024-04-14 — End: 2024-04-15

## 2024-04-14 MED ORDER — MEASLES, MUMPS & RUBELLA VAC IJ SOLR
INTRAMUSCULAR | Status: DC
Start: 2024-04-14 — End: 2024-04-17

## 2024-04-14 MED ORDER — ACETAMINOPHEN 325 MG PO TABS
325 | Freq: Once | ORAL | Status: AC
Start: 2024-04-14 — End: 2024-04-14
  Administered 2024-04-14: 17:00:00 975 mg via ORAL

## 2024-04-14 MED ORDER — DIPHENHYDRAMINE HCL 25 MG PO CAPS
25 | Freq: Four times a day (QID) | ORAL | Status: DC | PRN
Start: 2024-04-14 — End: 2024-04-17

## 2024-04-14 MED ORDER — BUPIVACAINE IN DEXTROSE 0.75-8.25 % IT SOLN
0.75-8.25 | Freq: Once | INTRATHECAL | Status: AC | PRN
Start: 2024-04-14 — End: 2024-04-14
  Administered 2024-04-14: 19:00:00 11.25 via INTRATHECAL

## 2024-04-14 MED ORDER — NORMAL SALINE FLUSH 0.9 % IV SOLN
0.9 | Freq: Two times a day (BID) | INTRAVENOUS | Status: DC
Start: 2024-04-14 — End: 2024-04-15

## 2024-04-14 MED ORDER — KETOROLAC TROMETHAMINE 30 MG/ML IJ SOLN
30 | Freq: Four times a day (QID) | INTRAMUSCULAR | Status: DC
Start: 2024-04-14 — End: 2024-04-15
  Administered 2024-04-15: 02:00:00 30 mg via INTRAVENOUS

## 2024-04-14 MED ORDER — KETOROLAC TROMETHAMINE 30 MG/ML IJ SOLN
30 | Freq: Four times a day (QID) | INTRAMUSCULAR | Status: AC | PRN
Start: 2024-04-14 — End: 2024-04-15

## 2024-04-14 MED ORDER — ONDANSETRON 4 MG PO TBDP
4 | Freq: Three times a day (TID) | ORAL | Status: DC | PRN
Start: 2024-04-14 — End: 2024-04-17

## 2024-04-14 MED ORDER — SIMETHICONE 80 MG PO CHEW
80 | Freq: Four times a day (QID) | ORAL | Status: DC | PRN
Start: 2024-04-14 — End: 2024-04-17
  Administered 2024-04-15 – 2024-04-16 (×2): 80 mg via ORAL

## 2024-04-14 MED ORDER — METHYLERGONOVINE MALEATE 0.2 MG/ML IJ SOLN
0.2 | INTRAMUSCULAR | Status: DC | PRN
Start: 2024-04-14 — End: 2024-04-17

## 2024-04-14 MED ORDER — LOPERAMIDE HCL 2 MG PO CAPS
2 | ORAL | Status: DC | PRN
Start: 2024-04-14 — End: 2024-04-15

## 2024-04-14 MED ORDER — DOCUSATE SODIUM 100 MG PO CAPS
100 | Freq: Two times a day (BID) | ORAL | Status: DC
Start: 2024-04-14 — End: 2024-04-17
  Administered 2024-04-15 – 2024-04-17 (×6): 100 mg via ORAL

## 2024-04-14 MED ORDER — IBUPROFEN 400 MG PO TABS
400 | Freq: Three times a day (TID) | ORAL | Status: DC
Start: 2024-04-14 — End: 2024-04-15

## 2024-04-14 MED ORDER — SODIUM CHLORIDE 0.9 % IV SOLN (MINI-BAG)
0.9 | Freq: Once | INTRAVENOUS | Status: DC | PRN
Start: 2024-04-14 — End: 2024-04-14

## 2024-04-14 MED ORDER — LACTATED RINGERS IV BOLUS
Freq: Once | INTRAVENOUS | Status: DC
Start: 2024-04-14 — End: 2024-04-14

## 2024-04-14 MED ORDER — NALBUPHINE HCL 10 MG/ML IJ SOLN
10 | INTRAMUSCULAR | Status: AC | PRN
Start: 2024-04-14 — End: 2024-04-15

## 2024-04-14 MED ORDER — FAMOTIDINE 20 MG PO TABS
20 | Freq: Two times a day (BID) | ORAL | Status: DC
Start: 2024-04-14 — End: 2024-04-17
  Administered 2024-04-15 – 2024-04-17 (×6): 20 mg via ORAL

## 2024-04-14 MED ORDER — DIPHENHYDRAMINE HCL 50 MG/ML IJ SOLN
50 | Freq: Once | INTRAMUSCULAR | Status: DC | PRN
Start: 2024-04-14 — End: 2024-04-14

## 2024-04-14 MED FILL — MEDELA TENDER CARE LANOLIN EX CREA: CUTANEOUS | Qty: 7 | Fill #0

## 2024-04-14 MED FILL — ACETAMINOPHEN 325 MG PO TABS: 325 mg | ORAL | Qty: 3 | Fill #0

## 2024-04-14 MED FILL — LACTATED RINGERS IV SOLN: INTRAVENOUS | Qty: 1000 | Fill #0

## 2024-04-14 MED FILL — FAMOTIDINE (PF) 20 MG/2ML IV SOLN: 20 MG/2ML | INTRAVENOUS | Qty: 2 | Fill #0

## 2024-04-14 MED FILL — MORPHINE SULFATE (PF) 0.5 MG/ML IJ SOLN: 0.5 mg/mL | INTRAMUSCULAR | Qty: 10 | Fill #0

## 2024-04-14 MED FILL — DEMEROL 25 MG/ML IJ SOLN: 25 mg/mL | INTRAMUSCULAR | Qty: 1 | Fill #0

## 2024-04-14 MED FILL — FENTANYL CITRATE (PF) 100 MCG/2ML IJ SOLN: 100 MCG/2ML | INTRAMUSCULAR | Qty: 2 | Fill #0

## 2024-04-14 MED FILL — PFIZERPEN 5000000 UNITS IJ SOLR: 5000000 [IU] | INTRAMUSCULAR | Qty: 5 | Fill #0

## 2024-04-14 MED FILL — NALBUPHINE HCL 10 MG/ML IJ SOLN: 10 mg/mL | INTRAMUSCULAR | Qty: 1 | Fill #0

## 2024-04-14 MED FILL — MORPHINE SULFATE (PF) 1 MG/ML IJ SOLN: 1 mg/mL | INTRAMUSCULAR | Qty: 10 | Fill #0

## 2024-04-14 MED FILL — CEFAZOLIN 3000 MG IN NS 100 ML IVPB: Qty: 100 | Fill #0

## 2024-04-14 NOTE — Anesthesia Procedure Notes (Addendum)
 Spinal Block    Patient location during procedure: OR  End time: 04/14/2024 3:10 PM  Reason for block: primary anesthetic  Staffing  Performed: anesthesiologist, other anesthesia staff and resident/CRNA   Anesthesiologist: Rhina Ozell BRAVO, MD  Resident/CRNA: Casandra Lisette BROCKS, APRN - CRNA  Other anesthesia staff: Floria Castleman, RN  Performed by: Casandra Lisette BROCKS, APRN - CRNA  Authorized by: Myrick Belling, MD    Spinal Block  Patient position: sitting  Prep: ChloraPrep  Patient monitoring: continuous pulse ox and frequent blood pressure checks  Approach: midline  Location: L3/L4  Guidance: Landmark technique  Provider prep: mask and sterile gloves  Needle  Needle type: Whitacre   Needle gauge: 22 G  Needle length: 3.5 in  Assessment  Sensory level: T4  Events: cerebrospinal fluid  Swirl obtained: Yes  CSF: clear  Attempts: 2  Hemodynamics: stable  Preanesthetic Checklist  Completed: patient identified, IV checked, site marked, risks and benefits discussed, surgical/procedural consents, equipment checked, pre-op evaluation, timeout performed, anesthesia consent given, oxygen available, monitors applied/VS acknowledged, fire risk safety assessment completed and verbalized and blood product R/B/A discussed and consented

## 2024-04-14 NOTE — Anesthesia Post-Procedure Evaluation (Signed)
 Department of Anesthesiology  Postprocedure Note    Patient: Gina Becker  MRN: [id number]  Birthdate: Jan 25, 1991  Date of evaluation: 04/14/2024    Procedure Summary       Date: 04/14/24 Room / Location: [facility] LD OR 02 / [facility] L&D OR    Anesthesia Start: 1442 Anesthesia Stop: 1614    Procedure: CESAREAN SECTION (Abdomen) Diagnosis:       S/P primary low transverse C-section      Breech birth, fetus 1      (S/P primary low transverse C-section [Z98.891] Breech birth, fetus 1 [O32.1XX1] 40.2)    Surgeons: Elnor Starr HEATH, MD Responsible Provider: Myrick Belling, MD    Anesthesia Type: Spinal ASA Status: 2            Anesthesia Type: Spinal    Aldrete Phase I: Aldrete Score: 9    Aldrete Phase II: Aldrete Score: 9    Anesthesia Post Evaluation    Patient location during evaluation: PACU  Patient participation: complete - patient participated  Level of consciousness: awake and alert  Pain score: 0  Airway patency: patent  Nausea & Vomiting: no vomiting and no nausea  Cardiovascular status: hemodynamically stable  Respiratory status: acceptable  Hydration status: euvolemic  Multimodal analgesia pain management approach      No notable events documented.

## 2024-04-14 NOTE — Lactation Note (Addendum)
 This note was copied from a baby's chart.  Lactation visit    Mother is a 33 y.o. G2P1 c-section delivery at [redacted] weeks gestation. Mother has no significant medical or surgical history that could impede breastfeeding.    Mother reports breastfeeding is going slowly. Mother reports she has had several attempts since delivery and infant is sleepy. Educated mother on feeding tendencies of an infant in the first 24 hours of life and techniques to wake and stimulate infant for feeds. Mother encouraged to do skin to skin. Educated mother on how to hand express and mother is able to demonstrate it back.    Spoke with mother about feeding tendencies of an infant less than 24 hours of life and void and stool expectations. Spoke with mother about colostrum benefits, infant stomach size, and offering the breast every 3-4 hours or on demand. Mother aware attempts count. Mother receptive to teaching and aware to call lactation for assistance if desired.    2330  In for latch assist. Infant is unwrapped and placed in FB hold on the R breast. Educated mother on positioning, hand placement, head control, how to support infant, and hold breast. On exam, mother has short nipples and infant has a high palate and uncoordinated suck. Infant offers 3 short sucks bursts and comes off the breast crying. No relatch achieved. Infant placed skin to skin with mother.

## 2024-04-14 NOTE — Progress Notes (Signed)
 OB Progress Note.     Patient seen at bedside. Informed consent obtained and witnessed for primary c-section for breech presentation. Presentation confirmed by Dr. Mayo this afternoon. All questions answered.     Gina Grieco Elnor II MD

## 2024-04-14 NOTE — Plan of Care (Signed)
 Problem: Vaginal Birth or Cesarean Section  Goal: Fetal and maternal status remain reassuring during the birth process  Description:  Birth OB-Pregnancy care plan goal which identifies if the fetal and maternal status remain reassuring during the birth process  04/14/2024 1357 by Mady Glad, RN  Outcome: Progressing  04/14/2024 0135 by Queen Birmingham, RN  Outcome: Progressing     Problem: Pain  Goal: Verbalizes/displays adequate comfort level or baseline comfort level  04/14/2024 1357 by Mady Glad, RN  Outcome: Progressing  04/14/2024 0135 by Queen Birmingham, RN  Outcome: Progressing     Problem: Infection - Adult  Goal: Absence of infection at discharge  04/14/2024 1357 by Mady Glad, RN  Outcome: Progressing  04/14/2024 0135 by Queen Birmingham, RN  Outcome: Progressing  Goal: Absence of infection during hospitalization  04/14/2024 1357 by Mady Glad, RN  Outcome: Progressing  04/14/2024 0135 by Queen Birmingham, RN  Outcome: Progressing  Goal: Absence of fever/infection during anticipated neutropenic period  04/14/2024 1357 by Mady Glad, RN  Outcome: Progressing  04/14/2024 0135 by Queen Birmingham, RN  Outcome: Progressing     Problem: Safety - Adult  Goal: Free from fall injury  04/14/2024 1357 by Mady Glad, RN  Outcome: Progressing  04/14/2024 0135 by Queen Birmingham, RN  Outcome: Progressing     Problem: Discharge Planning  Goal: Discharge to home or other facility with appropriate resources  04/14/2024 1357 by Mady Glad, RN  Outcome: Progressing  04/14/2024 0135 by Queen Birmingham, RN  Outcome: Progressing     Problem: Chronic Conditions and Co-morbidities  Goal: Patient's chronic conditions and co-morbidity symptoms are monitored and maintained or improved  04/14/2024 1357 by Mady Glad, RN  Outcome: Progressing  04/14/2024 0135 by Queen Birmingham, RN  Outcome: Progressing

## 2024-04-14 NOTE — Progress Notes (Addendum)
 0715: SBAR received from IVAR Minus, RN    [phone]: Dr. Mayo at bedside, confirmed breech presentation.     1350: SBAR given to A. McClain, RN and E. Pantelides, RN

## 2024-04-14 NOTE — Op Note (Signed)
 Cesarean Section Op Note    Patient: Gina Becker  DATE:  April 14, 2024  Date of Birth: 1991-02-14  MRN: [id number]    Preoperative Diagnoses:  33 y.o. G2P0010 [redacted]w[redacted]d c-section for fetal malpresentation    Postoperative diagnoses: Same      Procedure(s):  Primary low flap transverse cesarean section with double layer closure via pfannensteil skin incision    Surgeon(s):  Elnor Starr HEATH, MD    Surgical Assistant: Dena Backer    First Assistant Tasks:  Surgical Assistant assisted with integral tasks during the surgery including but not limited to positioning, prepping and draping, opening, closing, tissue retraction, tissue dissection, and hemostasis.    Anesthesia: Spinal     Estimated Blood Loss (mL): 500    Complications: None    Specimens: Cord blood, cord gas    Implants: Surgicel at hysterotomy    Drains: Foley to gravity    Findings: Baby boy in frank breech position at 15:25. APGARS 6/9. Birth weight 4570g. Amniotic fluid clear. Placenta removed via crede. Normal uterus, tubes, and ovaries.      Procedure:   Risks, benefits, and alternatives were explained to the patient and informed consent was obtained. The patient was taken to the operating room where spinal block was administered and found to be adequate. The patient was then prepped and draped in normal sterile fashion in a dorsal supine position with a leftward tilt. Ancef  was given prior to procedure. A Pfannelsteil skin incision was made and carried down to the underlying layer of fascia using the bovie    The fascia was then incised in the midline and the incision was extended laterally with mayo scissors. Kocher clamps were then used to grasp the inferior aspect of the fascial incision. It was tented up and the rectus muscle dissected off bluntly and with the mayo scissors. In a similar fashion, the superior aspect of the fascial incision was grasped with Kocher clamps, tented up and the rectus muscle dissected off bluntly and with the mayo  scissors. The rectus muscles were separated in the midline. The peritoneum was identified and entered bluntly. The peritoneal incision was then extended superiorly and inferiorly with good visualization of the bladder. The bladder blade was inserted. The vesiculouterine peritoneum was identified, grasped and entered sharply using the Metzenbaum scissors. The incision was then extended laterally and the bladder flap created digitally. The bladder blade was reinserted. A transverse uterine incision was then made using the scalpel and extended bluntly. The bladder blade was removed and the infants breech was then delivered atraumatically in sacrum anterior position. The rest of the infant's body was then delivered with ease. The cord was clamped x 2 and cut. The infant was handed to the awaiting pediatricians.      The placenta was then removed via crede, the uterus was exteriorized cleared of all clots and debris before the uterine incision was repaired. The uterine incision was then repaired using #1 vicryl in a running locked fashion in a single layer. A second layer of the same suture was used to obtain excellent hemostasis. The bladder flap was inspected and hemostasis was noted. The uterus was returned to the pelvis. The gutters were cleared of all clots and debris. Hemostasis was noted. The peritoneum was reapproximated using 2-0 vicryl. The fascial incision was then closed using a 0 vicryl suture in a running fashion in a single layer. Hemostasis was noted in the subcutaneous layer which was reapproximated using 2-0 plaingut. The skin was  closed using 4-0 monocryl. A sterile dressing was placed over the incision.    The pt tolerated the procedure well. Sponge, lap and needle counts were correct x2. The pt was taken to the recovery room in a stable condition.     Harve Spradley SHAUNNA Pereyra II MD

## 2024-04-14 NOTE — Anesthesia Pre-Procedure Evaluation (Addendum)
 Department of Anesthesiology  Preprocedure Note       Name:  Gina Becker   Age:  33 y.o.  DOB:  [birth year]                                          MRN:  [id number]         Date:  04/14/2024      Surgeon: Clotilde):  Elnor Starr HEATH, MD    Procedure: Procedure(s):  CESAREAN SECTION    Medications prior to admission:   Prior to Admission medications   Medication Sig Start Date End Date Taking? Authorizing Provider   Prenatal Multivit-Min-Fe-FA (PRENATAL 1 + IRON  PO) Take by mouth   Yes [provider]   predniSONE  5 MG (21) TBPK Use as directed  Patient not taking: Reported on 04/13/2024 02/15/23   Harvill, April J, PA-C   triamcinolone  (KENALOG ) 0.1 % cream Apply topically 2 times daily.  Patient not taking: Reported on 04/13/2024 02/15/23   Lenoard, April J, PA-C       Current medications:    Current Facility-Administered Medications   Medication Dose Route Frequency Provider Last Rate Last Admin   . lactated ringers  infusion   IntraVENous Continuous Laureta, Lenny, DO 125 mL/hr at 04/14/24 0700 New Bag at 04/14/24 0700   . sodium chloride  flush 0.9 % injection 5-40 mL  5-40 mL IntraVENous 2 times per day Eva Shine, DO       . sodium chloride  flush 0.9 % injection 10 mL  10 mL IntraVENous PRN Eva Shine, DO       . 0.9 % sodium chloride  infusion   IntraVENous PRN Laureta, Lenny, DO       . oxytocin  (PITOCIN ) 30 units in 500 mL infusion  87.3 milli-units/min IntraVENous Continuous PRN Laureta, Lenny, DO        And   . oxytocin  (PITOCIN ) 10 unit bolus from the bag  10 Units IntraVENous PRN Laureta, Lenny, DO       . ondansetron  (ZOFRAN ) injection 4 mg  4 mg IntraVENous Q6H PRN Eva Shine, DO       . lactated ringers  bolus 1,000 mL  1,000 mL IntraVENous Once Laureta, Lenny, DO       . acetaminophen  (TYLENOL ) tablet 975 mg  975 mg Oral Once Gray, Derwin II, MD       . famotidine  (PEPCID ) 20 MG/2ML 20 mg in sodium chloride  (PF) 0.9 % 10 mL injection  20 mg IntraVENous Once Laureta, Lenny, DO       .  ceFAZolin  (ANCEF ) 3000 mg in sodium chloride  0.9% 100 mL IVPB  3,000 mg IntraVENous Once Laureta, Lenny, DO       . nalbuphine  (NUBAIN ) injection 5 mg  5 mg IntraVENous Q3H PRN Eva Shine, DO   5 mg at 04/14/24 0007       Allergies:  No Known Allergies    Problem List:    Patient Active Problem List   Diagnosis Code   . Post-term pregnancy, 40-42 weeks of gestation O48.0   . Gestational hypertension, third trimester O13.3   . Breech presentation O32.1XX0   . Polyhydramnios in third trimester O40.3XX0   . 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 Antepartum malpresentation of fetus O32.9XX0       Past Medical History:  Diagnosis Date   . Galactorrhea not associated with childbirth    . Macrosomia    . Polyhydramnios        Past Surgical History:  History reviewed. No pertinent surgical history.    Social History:    Social History     Tobacco Use   . Smoking status: Never   . Smokeless tobacco: Never   Substance Use Topics   . Alcohol use: Never                                Counseling given: Not Answered      Vital Signs (Current):   Vitals:    04/14/24 0510 04/14/24 0701 04/14/24 0827 04/14/24 0835   BP:  120/77 126/71    Pulse:  69 72    Resp:   18    Temp:   98.2 F (36.8 C)    TempSrc:   Oral    SpO2: 99%  100% 100%   Weight:       Height:                                                  BP Readings from Last 3 Encounters:   04/14/24 126/71   02/15/23 117/81       NPO Status:                                                                                 BMI:   Wt Readings from Last 3 Encounters:   04/13/24 126.1 kg (278 lb)   02/15/23 99.8 kg (220 lb)     Body mass index is 42.27 kg/m.    CBC:   Lab Results   Component Value Date/Time    WBC 11.9 04/13/2024 04:10 PM    RBC 4.08 04/13/2024 04:10 PM    HGB 11.1 04/13/2024 04:10 PM    HCT 34.1 04/13/2024 04:10 PM    MCV 83.6 04/13/2024 04:10 PM    RDW 50.6 04/13/2024 04:10 PM    PLT 205 04/13/2024 04:10 PM        CMP:   Lab Results   Component Value Date/Time    NA 140 04/13/2024 04:10 PM    K 3.8 04/13/2024 04:10 PM    CL 104 04/13/2024 04:10 PM    CO2 24 04/13/2024 04:10 PM    BUN 8 04/13/2024 04:10 PM    CREATININE 0.65 04/13/2024 04:10 PM    GFRAA >60.0 04/13/2024 04:10 PM    LABGLOM >60 04/13/2024 04:10 PM    GLUCOSE 121 04/13/2024 04:10 PM    CALCIUM  9.4 04/13/2024 04:10 PM    BILITOT 0.30 04/13/2024 04:10 PM    ALKPHOS 192 04/13/2024 04:10 PM    AST 15.0 04/13/2024 04:10 PM    ALT <7 04/13/2024 04:10 PM       POC Tests: No results for input(s): POCGLU, POCNA, POCK, POCCL, POCBUN, POCHEMO, POCHCT in the last 72 hours.    Coags: No results  found for: PROTIME, INR, APTT    HCG (If Applicable): No results found for: PREGTESTUR, PREGSERUM, HCG, HCGQUANT     ABGs: No results found for: PHART, PO2ART, PCO2ART, HCO3ART, BEART, O2SATART     Type & Screen (If Applicable):  Lab Results   Component Value Date    ABORH A Rh Positive 04/13/2024    LABANTI NEG 04/13/2024       Drug/Infectious Status (If Applicable):  No results found for: HIV, HEPCAB    COVID-19 Screening (If Applicable): No results found for: COVID19        Anesthesia Evaluation          Airway:  Mallampati: II            Dental:           Pulmonary:     breath sounds clear to auscultation           Cardiovascular:       (+)     hypertension:                                            Rhythm: regular  Rate: normal                Neuro/Psych:               GI/Hepatic/Renal:            Endo/Other:                 ROS comment: Term. Macrosomnia, polyhydramnios       Abdominal:              Vascular:         Other Findings:            Anesthesia Plan      spinal     ASA 2           MIPS: Postoperative opioids intended and Prophylactic antiemetics administered.  Anesthetic plan and risks discussed with patient.      Plan discussed with CRNA.    Attending anesthesiologist reviewed and agrees with Preprocedure  content              OZELL FORBES COUNTESS, MD   04/14/2024

## 2024-04-14 NOTE — Progress Notes (Addendum)
 1346 SBAR report received from K. Owens RN    1430 Dr. Elnor at bedside,    1441 Pt ambulates to OR2.    1610 Pt transferred to RR1 via stretcher.    1815 TRANSFER - OUT REPORT:    Verbal report given to Rock Bracket on Elberta Lachapelle  being transferred to [facility] for routine progression of patient care       Report consisted of patient's Situation, Background, Assessment and   Recommendations(SBAR).     Information from the following report(s) Nurse Handoff Report, Intake/Output, MAR, and Recent Results was reviewed with the receiving nurse.           Lines:   Peripheral IV 04/13/24 Posterior;Right Wrist (Active)   Site Assessment Clean, dry & intact 04/14/24 1654   Line Status Infusing 04/14/24 1654   Line Care Connections checked and tightened 04/14/24 1654   Phlebitis Assessment No symptoms 04/14/24 1654   Infiltration Assessment 0 04/14/24 1654   Alcohol Cap Used Yes 04/14/24 0836   Dressing Status Clean, dry & intact 04/14/24 1654   Dressing Type Transparent 04/14/24 1654   Dressing Intervention New 04/14/24 0836        Opportunity for questions and clarification was provided.      Patient transported with:  Registered Nurse

## 2024-04-15 LAB — CBC
Hematocrit: 29.3 % — ABNORMAL LOW (ref 35.0–47.0)
Hemoglobin: 9.4 g/dL — ABNORMAL LOW (ref 11.0–16.0)
MCH: 27.2 pg (ref 25.4–34.6)
MCHC: 32.1 g/dL (ref 30.0–36.0)
MCV: 84.7 fL (ref 80.0–98.0)
MPV: 12.9 fL — ABNORMAL HIGH (ref 6.0–10.0)
Platelets: 174 1000/mm3 (ref 140–450)
RBC: 3.46 M/uL — ABNORMAL LOW (ref 3.60–5.20)
RDW: 49.4 — ABNORMAL HIGH (ref 36.4–46.3)
WBC: 17.1 1000/mm3 — ABNORMAL HIGH (ref 4.0–11.0)

## 2024-04-15 MED ORDER — ACETAMINOPHEN 500 MG PO TABS
500 | Freq: Three times a day (TID) | ORAL | Status: DC
Start: 2024-04-15 — End: 2024-04-17
  Administered 2024-04-15 – 2024-04-17 (×7): 1000 mg via ORAL

## 2024-04-15 MED ORDER — SODIUM CHLORIDE 0.9 % IV SOLN
0.9 | INTRAVENOUS | Status: DC | PRN
Start: 2024-04-15 — End: 2024-04-17

## 2024-04-15 MED ORDER — MAGNESIUM SULFATE 4 GM/100ML IV SOLN
4 | Freq: Once | INTRAVENOUS | Status: DC
Start: 2024-04-15 — End: 2024-04-15

## 2024-04-15 MED ORDER — NORMAL SALINE FLUSH 0.9 % IV SOLN
0.9 | Freq: Two times a day (BID) | INTRAVENOUS | Status: DC
Start: 2024-04-15 — End: 2024-04-15

## 2024-04-15 MED ORDER — HYDRALAZINE HCL 20 MG/ML IJ SOLN
20 | Freq: Once | INTRAMUSCULAR | Status: DC | PRN
Start: 2024-04-15 — End: 2024-04-15

## 2024-04-15 MED ORDER — LABETALOL HCL 5 MG/ML IV SOLN
5 | Freq: Once | INTRAVENOUS | Status: DC | PRN
Start: 2024-04-15 — End: 2024-04-15

## 2024-04-15 MED ORDER — NORMAL SALINE FLUSH 0.9 % IV SOLN
0.9 | INTRAVENOUS | Status: DC | PRN
Start: 2024-04-15 — End: 2024-04-17

## 2024-04-15 MED ORDER — IBUPROFEN 400 MG PO TABS
400 | Freq: Three times a day (TID) | ORAL | Status: DC
Start: 2024-04-15 — End: 2024-04-17
  Administered 2024-04-15 – 2024-04-17 (×7): 800 mg via ORAL

## 2024-04-15 MED ORDER — MAGNESIUM SULFATE 20000 MG/500 ML INFUSION
20 | INTRAVENOUS | Status: DC
Start: 2024-04-15 — End: 2024-04-15

## 2024-04-15 MED ORDER — CALCIUM GLUCONATE 10 % IV SOLN
10 | INTRAVENOUS | Status: DC | PRN
Start: 2024-04-15 — End: 2024-04-15

## 2024-04-15 MED ORDER — LACTATED RINGERS IV SOLN
INTRAVENOUS | Status: DC
Start: 2024-04-15 — End: 2024-04-15

## 2024-04-15 MED FILL — FEROSUL 325 (65 FE) MG PO TABS: 325 (65 Fe) MG | ORAL | Qty: 1 | Fill #0

## 2024-04-15 MED FILL — IBUPROFEN 400 MG PO TABS: 400 mg | ORAL | Qty: 2 | Fill #0

## 2024-04-15 MED FILL — OXYCODONE HCL 5 MG PO TABS: 5 mg | ORAL | Qty: 1 | Fill #0

## 2024-04-15 MED FILL — PRENATAL-U 106.5-1 MG PO CAPS: 106.5-1 mg | ORAL | Qty: 1 | Fill #0

## 2024-04-15 MED FILL — LACTATED RINGERS IV SOLN: INTRAVENOUS | Qty: 1000 | Fill #0

## 2024-04-15 MED FILL — SIMETHICONE 80 MG PO CHEW: 80 mg | ORAL | Qty: 1 | Fill #0

## 2024-04-15 MED FILL — SODIUM CHLORIDE FLUSH 0.9 % IV SOLN: 0.9 % | INTRAVENOUS | Qty: 10 | Fill #0

## 2024-04-15 MED FILL — ACETAMINOPHEN EXTRA STRENGTH 500 MG PO TABS: 500 mg | ORAL | Qty: 2 | Fill #0

## 2024-04-15 MED FILL — FAMOTIDINE 20 MG PO TABS: 20 mg | ORAL | Qty: 1 | Fill #0

## 2024-04-15 MED FILL — DOCUSATE SODIUM 100 MG PO CAPS: 100 mg | ORAL | Qty: 1 | Fill #0

## 2024-04-15 MED FILL — KETOROLAC TROMETHAMINE 30 MG/ML IJ SOLN: 30 mg/mL | INTRAMUSCULAR | Qty: 1 | Fill #0

## 2024-04-15 NOTE — Progress Notes (Signed)
Progress Note    Patient: Gina Becker MRN: [id number]  SSN: [id number]    Date of Birth: 23-Aug-1990  Age: 33 y.o.  Sex: female      Subjective:     Postpartum Day: 1     Delivery: cesarean delivery  Pt denies complaints. Pt denies lightheadedness, dizziness, SOB, RUQ pain, headache, visual changes or chest pain.  Prenatal course noted for LGA, polyhydramnios, + GBS, elevated HgbA1C in early pregnancy - early glucola WNL.      Patient feels well. Pain is well controlled with current medications. The patient is ambulating well. Flatus has been passed.  The baby is female. Baby is breast feeding.       Objective:      Patient Vitals for the past 24 hrs:   BP Temp Temp src Pulse Resp SpO2   04/15/24 0617 116/74 98.1 F (36.7 C) Oral 85 17 98 %   04/14/24 2158 111/67 97.3 F (36.3 C) Oral 50 17 96 %   04/14/24 1805 133/68 97.7 F (36.5 C) Oral 72 18 98 %   04/14/24 1745 -- -- -- 68 16 98 %   04/14/24 1744 -- -- -- 70 12 97 %   04/14/24 1743 -- 97.4 F (36.3 C) Oral 69 17 97 %   04/14/24 1742 -- -- -- 69 16 97 %   04/14/24 1741 -- -- -- 71 17 96 %   04/14/24 1740 -- -- -- 74 18 96 %   04/14/24 1739 -- -- -- 75 17 96 %   04/14/24 1738 -- -- -- 74 17 96 %   04/14/24 1737 -- -- -- 67 22 97 %   04/14/24 1736 -- -- -- 75 16 97 %   04/14/24 1735 -- -- -- 67 15 96 %   04/14/24 1734 -- -- -- 68 14 97 %   04/14/24 1733 -- -- -- 68 14 97 %   04/14/24 1732 -- -- -- 66 17 97 %   04/14/24 1731 115/68 -- -- 69 15 97 %   04/14/24 1730 -- -- -- 76 15 96 %   04/14/24 1729 -- -- -- 74 16 97 %   04/14/24 1728 -- -- -- 74 15 98 %   04/14/24 1727 -- -- -- 73 16 97 %   04/14/24 1726 -- -- -- 77 16 97 %   04/14/24 1725 -- -- -- 76 21 97 %   04/14/24 1724 -- -- -- 73 14 97 %   04/14/24 1723 -- -- -- 81 15 96 %   04/14/24 1722 -- -- -- 72 18 96 %   04/14/24 1721 -- -- -- 77 24 98 %   04/14/24 1720 -- -- -- 82 17 97 %   04/14/24 1719 -- -- -- 72 16 98 %   04/14/24 1718 -- -- -- 78 18 96 %   04/14/24 1717 -- -- -- 79 18 96 %   04/14/24 1716 -- --  -- 86 16 97 %   04/14/24 1715 -- -- -- 76 15 97 %   04/14/24 1714 -- -- -- 76 14 98 %   04/14/24 1713 -- -- -- 70 17 98 %   04/14/24 1712 -- -- -- 67 12 98 %   04/14/24 1711 -- -- -- 72 13 97 %   04/14/24 1710 -- -- -- 69 14 98 %   04/14/24 1709 -- -- -- 70 14 97 %   04/14/24 1708 -- -- --  69 24 98 %   04/14/24 1707 -- -- -- 71 20 98 %   04/14/24 1706 -- -- -- 73 15 98 %   04/14/24 1705 -- -- -- 68 16 98 %   04/14/24 1704 -- -- -- 71 18 98 %   04/14/24 1703 -- -- -- 69 15 97 %   04/14/24 1702 -- -- -- 77 18 97 %   04/14/24 1701 121/67 -- -- 73 20 96 %   04/14/24 1700 -- -- -- 63 18 96 %   04/14/24 1659 -- -- -- 67 18 97 %   04/14/24 1658 -- -- -- 72 18 98 %   04/14/24 1657 -- -- -- 69 18 97 %   04/14/24 1656 -- -- -- 70 17 98 %   04/14/24 1655 -- -- -- 70 20 99 %   04/14/24 1654 -- -- -- 68 16 98 %   04/14/24 1653 -- -- -- 69 13 99 %   04/14/24 1652 -- -- -- 67 16 98 %   04/14/24 1651 -- -- -- 74 16 97 %   04/14/24 1650 -- -- -- 77 15 96 %   04/14/24 1649 -- -- -- 70 14 97 %   04/14/24 1648 -- -- -- 68 20 97 %   04/14/24 1647 -- -- -- 77 15 98 %   04/14/24 1646 -- -- -- 72 13 98 %   04/14/24 1645 126/78 -- -- 75 15 97 %   04/14/24 1644 -- -- -- 69 15 97 %   04/14/24 1643 -- -- -- 70 18 97 %   04/14/24 1642 -- -- -- 68 19 97 %   04/14/24 1641 -- -- -- 75 16 98 %   04/14/24 1640 -- -- -- 70 13 97 %   04/14/24 1639 -- -- -- 74 20 98 %   04/14/24 1638 -- -- -- 72 17 97 %   04/14/24 1637 -- -- -- 71 19 97 %   04/14/24 1636 -- -- -- 72 15 98 %   04/14/24 1635 -- -- -- 72 20 98 %   04/14/24 1634 -- -- -- 75 17 97 %   04/14/24 1633 -- -- -- 74 17 97 %   04/14/24 1632 -- -- -- 76 16 96 %   04/14/24 1631 115/76 -- -- 70 19 98 %   04/14/24 1630 -- -- -- 81 18 97 %   04/14/24 1629 -- -- -- 75 14 98 %   04/14/24 1628 -- -- -- 76 14 97 %   04/14/24 1627 -- -- -- 75 16 96 %   04/14/24 1621 -- -- -- 77 27 97 %   04/14/24 1620 -- -- -- 79 19 98 %   04/14/24 1619 113/73 -- -- 76 16 97 %   04/14/24 1618 -- -- -- 78 16 97 %   04/14/24  1617 -- -- -- 76 14 98 %   04/14/24 1616 113/73 -- -- 73 28 98 %   04/14/24 1615 113/73 -- -- 79 15 98 %   04/14/24 1614 113/73 -- -- -- -- --   04/14/24 1425 -- -- -- -- -- 99 %   04/14/24 1420 -- -- -- -- -- 99 %   04/14/24 1415 -- -- -- -- -- 99 %   04/14/24 1410 -- -- -- -- -- 100 %   04/14/24 1409 -- -- -- -- -- 100 %     04/14/24 1405 -- -- -- -- -- 99 %   04/14/24 1403 127/85 -- -- 90 -- --   04/14/24 1400 -- -- -- -- -- 99 %   04/14/24 1355 -- -- -- -- -- 100 %   04/14/24 1350 -- -- -- -- -- 100 %   04/14/24 1345 -- -- -- -- -- 99 %   04/14/24 1340 -- -- -- -- -- 100 %   04/14/24 1335 -- -- -- -- -- 100 %   04/14/24 1330 -- -- -- -- -- 100 %   04/14/24 1315 -- -- -- -- -- 100 %   04/14/24 1311 136/76 -- -- 85 -- --     Temp (24hrs), Avg:97.6 F (36.4 C), Min:97.3 F (36.3 C), Max:98.1 F (36.7 C)      General: alert, appears stated age, and cooperative   Uterine Fundus:   firm nontender   Incision:  Well approximated, c/d/i     Lab/Data Review:  Recent Results (from the past 72 hours)   CBC with Auto Differential    Collection Time: 04/13/24  4:10 PM   Result Value Ref Range    WBC 11.9 (H) 4.0 - 11.0 1000/mm3    RBC 4.08 3.60 - 5.20 M/uL    Hemoglobin 11.1 11.0 - 16.0 gm/dl    Hematocrit 65.8 (L) 35.0 - 47.0 %    MCV 83.6 80.0 - 98.0 fL    MCH 27.2 25.4 - 34.6 pg    MCHC 32.6 30.0 - 36.0 gm/dl    Platelets 794 859 - 450 1000/mm3    MPV 12.4 (H) 6.0 - 10.0 fL    RDW 50.6 (H) 36.4 - 46.3      Nucleated RBCs 0 0 - 0      Immature Granulocytes % 0.6 0.0 - 3.0 %    Neutrophils Segmented 74.8 (H) 34 - 64 %    Lymphocytes 16.0 (L) 28 - 48 %    Monocytes 7.0 1 - 13 %    Eosinophils 1.3 0 - 5 %    Basophils 0.3 0 - 3 %   RPR W/Reflex Titer and Confirmation    Collection Time: 04/13/24  4:10 PM   Result Value Ref Range    RPR NONREACTIVE NONREACTIVE     Comprehensive metabolic panel    Collection Time: 04/13/24  4:10 PM   Result Value Ref Range    Potassium 3.8 3.5 - 5.1 mEq/L    Chloride 104 98 - 107 mEq/L     Sodium 140 136 - 145 mEq/L    CO2 24 20 - 31 mEq/L    Glucose 121 (H) 74 - 106 mg/dl    BUN 8 (L) 9 - 23 mg/dl    Creatinine 9.34 9.44 - 1.02 mg/dl    GFR African American >60.0      GFR Non-African American >60      Calcium  9.4 8.7 - 10.4 mg/dl    Anion Gap 12 5 - 15 mmol/L    AST 15.0 0.0 - 33.9 U/L    ALT <7 (L) 10 - 49 U/L    Alkaline Phosphatase 192 (H) 46 - 116 U/L    Total Bilirubin 0.30 0.30 - 1.20 mg/dl    Total Protein 6.8 5.7 - 8.2 gm/dl    Albumin 2.8 (L) 3.4 - 5.0 gm/dl   Lactate Dehydrogenase    Collection Time: 04/13/24  4:10 PM   Result Value Ref Range    LD 153 120 -  246 U/L   Protein / creatinine ratio, urine    Collection Time: 04/13/24  4:10 PM   Result Value Ref Range    Creatinine, Random Urine 82.6 30.0 - 125.0 mg/dl    Protein, Urine, Random 9.8 1.0 - 14.0 mg/dl    Urine Total Protein Creatinine Ratio 0.12 0.00 - 0.19 gm/24 Hr   ABO/RH    Collection Time: 04/13/24  4:10 PM   Result Value Ref Range    ABO/Rh A Rh Positive     ANTIBODY SCREEN    Collection Time: 04/13/24  4:10 PM   Result Value Ref Range    Antibody Screen NEG     ABO/RH Retype    Collection Time: 04/13/24  4:15 PM   Result Value Ref Range    ABO/RH Retype A Rh Positive     CBC    Collection Time: 04/15/24  6:14 AM   Result Value Ref Range    WBC 17.1 (H) 4.0 - 11.0 1000/mm3    RBC 3.46 (L) 3.60 - 5.20 M/uL    Hemoglobin 9.4 (L) 11.0 - 16.0 gm/dl    Hematocrit 70.6 (L) 35.0 - 47.0 %    MCV 84.7 80.0 - 98.0 fL    MCH 27.2 25.4 - 34.6 pg    MCHC 32.1 30.0 - 36.0 gm/dl    Platelets 825 859 - 450 1000/mm3    MPV 12.9 (H) 6.0 - 10.0 fL    RDW 49.4 (H) 36.4 - 46.3             Assessment:     Status post: Post op cesarean section Day #1, asymptomatic anemia     Plan:   routine post partum care  Will repeat CBC in AM    .    Signed By: Vernell FORBES Hai, APRN - NP     April 15, 2024

## 2024-04-15 NOTE — Lactation Note (Addendum)
 This note was copied from a baby's chart.  Lactation visit    Mother denies pain and reports breastfeeding is going well. Mother reports multiple feeds with void and stool. Mother reports being comfortable with latching on her own without assistance.     Spoke with mother about feeding tendencies of an infant over 24 hours of life, how to wake the infant for feds, second night syndrome, and clusterfeeding. The mother is aware to continue to attempt to feed every 3-4 hours or on demand.     The mother acknowledges education that d/t delivery via c/s the patient may retain more amniotic fluid and therefore become spitty, to report to the nurse an emesis that is bright red or green, and may have weight loss upwards of 8%.     The mother reports the pt will be circumcised; spoke w/mother regarding feeding disruption for 6-7 hours following the circ and to attempt to wake newborn to attempt to feed every 3-4 hours.    The mother was provided with a breastfeeding booklet. Mother had no further questions or concerns at this time. The mother is receptive to teaching and is aware to call [facility] for assistance if desired.        1323  The LC was called to the bedside to assist w/latch. The pt was attempted on the R breast in the football position. The pt was fussy and placed STS to calm. The pt was latch on the R breast in the cross cradle position. Mother had no further questions or concerns at this time, is receptive to teaching, and is aware to call [facility] for assistance if desired. The pt was feeding upon LC departure from the pt room.

## 2024-04-16 LAB — CBC
Hematocrit: 27.5 % — ABNORMAL LOW (ref 35.0–47.0)
Hemoglobin: 8.8 g/dL — ABNORMAL LOW (ref 11.0–16.0)
MCH: 27.1 pg (ref 25.4–34.6)
MCHC: 32 g/dL (ref 30.0–36.0)
MCV: 84.6 fL (ref 80.0–98.0)
MPV: 12.7 fL — ABNORMAL HIGH (ref 6.0–10.0)
Platelets: 170 1000/mm3 (ref 140–450)
RBC: 3.25 M/uL — ABNORMAL LOW (ref 3.60–5.20)
RDW: 51.9 — ABNORMAL HIGH (ref 36.4–46.3)
WBC: 12.2 1000/mm3 — ABNORMAL HIGH (ref 4.0–11.0)

## 2024-04-16 MED ORDER — OXYCODONE HCL 5 MG PO TABS
5 | ORAL | Status: DC | PRN
Start: 2024-04-16 — End: 2024-04-17

## 2024-04-16 MED ORDER — OXYCODONE HCL 5 MG PO TABS
5 | ORAL | Status: DC | PRN
Start: 2024-04-16 — End: 2024-04-17
  Administered 2024-04-16 – 2024-04-17 (×2): 10 mg via ORAL

## 2024-04-16 MED FILL — ACETAMINOPHEN EXTRA STRENGTH 500 MG PO TABS: 500 mg | ORAL | Qty: 2 | Fill #0

## 2024-04-16 MED FILL — IBUPROFEN 400 MG PO TABS: 400 mg | ORAL | Qty: 2 | Fill #0

## 2024-04-16 MED FILL — DOCUSATE SODIUM 100 MG PO CAPS: 100 mg | ORAL | Qty: 1 | Fill #0

## 2024-04-16 MED FILL — FAMOTIDINE 20 MG PO TABS: 20 mg | ORAL | Qty: 1 | Fill #0

## 2024-04-16 MED FILL — OXYCODONE HCL 5 MG PO TABS: 5 mg | ORAL | Qty: 2 | Fill #0

## 2024-04-16 NOTE — Progress Notes (Signed)
 Progress Note    Patient: Gina Becker MRN: [id number]  SSN: [id number]    Date of Birth: October 15, 1990  Age: 33 y.o.  Sex: female      Subjective:     Postpartum Day: 2     Delivery: cesarean delivery  Pt denies complaints. Pt denies lightheadedness, dizziness, SOB, RUQ pain, headache, visual changes or chest pain.  Prenatal course noted for LGA, polyhydramnios, + GBS, elevated HgbA1C in early pregnancy - early glucola WNL.      Patient feels well. Pain is well controlled with current medications. The patient is ambulating well. Flatus has been passed.  The baby is   . Baby is breast feeding.       Objective:      Patient Vitals for the past 24 hrs:   BP Temp Temp src Pulse Resp SpO2   04/16/24 0513 118/72 97.7 F (36.5 C) Oral 89 17 99 %   04/16/24 0135 -- -- -- -- 18 --   04/15/24 2120 124/61 97.5 F (36.4 C) Oral 85 17 99 %   04/15/24 1402 110/68 97.7 F (36.5 C) Oral 91 18 99 %     Temp (24hrs), Avg:97.6 F (36.4 C), Min:97.5 F (36.4 C), Max:97.7 F (36.5 C)      General: alert, appears stated age, and cooperative   Uterine Fundus:   firm nontender   Incision:  Well approximated, c/d/i     Lab/Data Review:  Recent Results (from the past 72 hours)   CBC with Auto Differential    Collection Time: 04/13/24  4:10 PM   Result Value Ref Range    WBC 11.9 (H) 4.0 - 11.0 1000/mm3    RBC 4.08 3.60 - 5.20 M/uL    Hemoglobin 11.1 11.0 - 16.0 gm/dl    Hematocrit 65.8 (L) 35.0 - 47.0 %    MCV 83.6 80.0 - 98.0 fL    MCH 27.2 25.4 - 34.6 pg    MCHC 32.6 30.0 - 36.0 gm/dl    Platelets 794 859 - 450 1000/mm3    MPV 12.4 (H) 6.0 - 10.0 fL    RDW 50.6 (H) 36.4 - 46.3      Nucleated RBCs 0 0 - 0      Immature Granulocytes % 0.6 0.0 - 3.0 %    Neutrophils Segmented 74.8 (H) 34 - 64 %    Lymphocytes 16.0 (L) 28 - 48 %    Monocytes 7.0 1 - 13 %    Eosinophils 1.3 0 - 5 %    Basophils 0.3 0 - 3 %   RPR W/Reflex Titer and Confirmation    Collection Time: 04/13/24  4:10 PM   Result Value Ref Range    RPR NONREACTIVE NONREACTIVE      Comprehensive metabolic panel    Collection Time: 04/13/24  4:10 PM   Result Value Ref Range    Potassium 3.8 3.5 - 5.1 mEq/L    Chloride 104 98 - 107 mEq/L    Sodium 140 136 - 145 mEq/L    CO2 24 20 - 31 mEq/L    Glucose 121 (H) 74 - 106 mg/dl    BUN 8 (L) 9 - 23 mg/dl    Creatinine 9.34 9.44 - 1.02 mg/dl    GFR African American >60.0      GFR Non-African American >60      Calcium  9.4 8.7 - 10.4 mg/dl    Anion Gap 12 5 - 15 mmol/L    AST 15.0  0.0 - 33.9 U/L    ALT <7 (L) 10 - 49 U/L    Alkaline Phosphatase 192 (H) 46 - 116 U/L    Total Bilirubin 0.30 0.30 - 1.20 mg/dl    Total Protein 6.8 5.7 - 8.2 gm/dl    Albumin 2.8 (L) 3.4 - 5.0 gm/dl   Lactate Dehydrogenase    Collection Time: 04/13/24  4:10 PM   Result Value Ref Range    LD 153 120 - 246 U/L   Protein / creatinine ratio, urine    Collection Time: 04/13/24  4:10 PM   Result Value Ref Range    Creatinine, Random Urine 82.6 30.0 - 125.0 mg/dl    Protein, Urine, Random 9.8 1.0 - 14.0 mg/dl    Urine Total Protein Creatinine Ratio 0.12 0.00 - 0.19 gm/24 Hr   ABO/RH    Collection Time: 04/13/24  4:10 PM   Result Value Ref Range    ABO/Rh A Rh Positive     ANTIBODY SCREEN    Collection Time: 04/13/24  4:10 PM   Result Value Ref Range    Antibody Screen NEG     ABO/RH Retype    Collection Time: 04/13/24  4:15 PM   Result Value Ref Range    ABO/RH Retype A Rh Positive     CBC    Collection Time: 04/15/24  6:14 AM   Result Value Ref Range    WBC 17.1 (H) 4.0 - 11.0 1000/mm3    RBC 3.46 (L) 3.60 - 5.20 M/uL    Hemoglobin 9.4 (L) 11.0 - 16.0 gm/dl    Hematocrit 70.6 (L) 35.0 - 47.0 %    MCV 84.7 80.0 - 98.0 fL    MCH 27.2 25.4 - 34.6 pg    MCHC 32.1 30.0 - 36.0 gm/dl    Platelets 825 859 - 450 1000/mm3    MPV 12.9 (H) 6.0 - 10.0 fL    RDW 49.4 (H) 36.4 - 46.3             Assessment:     Status post: Post op day #2 cesarean section, asymptomatic anemia     Plan:   routine post partum care, awaiting AM lab results      .    Signed By: Vernell FORBES Hai, APRN - NP      April 16, 2024

## 2024-04-16 NOTE — Lactation Note (Signed)
 This note was copied from a baby's chart.  Lactation visit    Mother denies pain and reports breastfeeding is going slowly w/short feeds. Educated the mother on how to wake the infant for feds. Mother stated she will call for assistance w/latch for the next feeding at 1130.     The mother reports the pt will be circumcised; spoke w/mother regarding feeding disruption for 6-7 hours following the circ and to attempt to wake newborn to attempt to feed every 3-4 hours.    The mother is aware to continue to attempt to feed every 3-4 hours or on demand. Mother had no further questions or concerns at this time. The mother is receptive to teaching and is aware to call [facility] for assistance if desired.

## 2024-04-16 NOTE — Lactation Note (Addendum)
 This note was copied from a baby's chart.  Lactation visit    Mother denies pain and reports concern regarding breastfeeding times being short. Mother reports multiple short feeds with void and stool. The pt was attempted to latch on the L breast in the football position. The mothers breast was assessed as flat; a 20 mm nipple shield was provide as the newborn could not attain a latch or draw the nipple erect. The pt was changed into cross cradle position w/the nipple shield and latch was attained. The mother had questions on supplementation use. Supplementation use was discussed and the mother was unsure if she wanted to supplement at this time.    The mother is aware to continue to attempt to feed every 3-4 hours or on demand and how to wake the infant for feds. The mother reports the pt will be circumcised; spoke w/mother regarding feeding disruption for 6-7 hours following the circ and to attempt to wake newborn to attempt to feed every 3-4 hours.    Mother had no further questions or concerns at this time. The mother is receptive to teaching and is aware to call [facility] for assistance if desired.

## 2024-04-17 MED ORDER — ACETAMINOPHEN 500 MG PO TABS
500 | ORAL_TABLET | Freq: Three times a day (TID) | ORAL | 1 refills | Status: AC
Start: 2024-04-17 — End: ?

## 2024-04-17 MED ORDER — IRON 325 (65 FE) MG PO TABS
325 | ORAL_TABLET | Freq: Every day | ORAL | 1 refills | Status: AC
Start: 2024-04-17 — End: ?

## 2024-04-17 MED ORDER — IBUPROFEN 800 MG PO TABS
800 | ORAL_TABLET | Freq: Three times a day (TID) | ORAL | 1 refills | Status: AC
Start: 2024-04-17 — End: ?

## 2024-04-17 MED ORDER — OXYCODONE HCL 5 MG PO TABS
5 | ORAL_TABLET | Freq: Four times a day (QID) | ORAL | 0 refills | Status: AC | PRN
Start: 2024-04-17 — End: 2024-04-22

## 2024-04-17 MED FILL — IBUPROFEN 400 MG PO TABS: 400 mg | ORAL | Qty: 2 | Fill #0

## 2024-04-17 MED FILL — ACETAMINOPHEN EXTRA STRENGTH 500 MG PO TABS: 500 mg | ORAL | Qty: 2 | Fill #0

## 2024-04-17 MED FILL — OXYCODONE HCL 5 MG PO TABS: 5 mg | ORAL | Qty: 2 | Fill #0

## 2024-04-17 NOTE — Telephone Encounter (Addendum)
 ----------DocumentID: [id number] ALAN)-------------------------------------------    [facility]                       Patient Education Report         Name: SHAUNITA SENEY                  Date: 04/14/2024    MRN: [id number]                    Time: 93:67:75 PM         Patient ordered Safe Sleep Practices    from [id number]_[id number] at 06:32:24 PM    Synopsis Description: Safe Sleep Practices    ----------DocumentID: [id number] ALAN)-------------------------------------------    [facility]                       Patient Education Report         Name: ENGLISH TOMER                  Date: 04/14/2024    MRN: [id number]                    Time: 11:18:29 PM         Patient ordered Helpful Strategies for Successful Breastfeeding - CC    from [id number]_5883_[id number] at 11:18:29 PM    Synopsis Description: Watch some common, effective strategies that may help make breastfeeding easier.    ----------DocumentID: [id number] ALAN)-------------------------------------------    [facility]                       Patient Education Report         Name: SHAMEEKA SILLIMAN                  Date: 04/14/2024    MRN: [id number]                    Time: 11:24:15 PM         Patient ordered Common Breastfeeding Positions - CC    from [id number]_[id number] at 11:24:15 PM    Synopsis Description: Watch demonstrations of the three most common ways to hold your breastfeeding baby.    ----------DocumentID: [id number] (AP)-------------------------------------------              [facility]                       Patient Education Report         Name: CHESNI, VOS                  Date: 04/16/2024    MRN: [id number]                    Time: 12:49:50 AM         Patient ordered video: 'Patient Safety: Stay Safe While you are in the Hospital'    from [id number]_[id number] via phone number: [phone] at 12:49:50 AM    Description: This program outlines some of the precautions patients can take to ensure a speedy recovery  without extra complications. The video emphasizes the importance of communicating with the healthcare team.    ----------DocumentID: [id number] (AP)-------------------------------------------              [facility]  Patient Education Report         Name: CAMAYA, GANNETT                  Date: 04/16/2024    MRN: [id number]                    Time: 12:49:56 AM         Patient ordered video: 'Safe Sleep Practices'    from [id number]_[id number] via phone number: [phone] at 12:49:56 AM    Description: Safe Sleep Practices    ----------DocumentID: [id number] (AP)-------------------------------------------              [facility]                       Patient Education Report         Name: VENBA, ZENNER                  Date: 04/16/2024    MRN: [id number]                    Time: 12:50:36 AM         Patient ordered survey: [place] Scale (EPDS)    from [id number]_[id number] via phone number: [phone] at 12:50:36 AM    <p>Since you have recently had a baby, we want to know how you feel.&nbsp  This is a&nbsp  screening test   not a medical diagnosis. If something doesn't seem right let us  know right away.&nbsp  Please select&nbsp  the answer that comes closest to how you have felt-IN THE PAST 7 DAYS-not just how you feel today.</p>    Question 1: I have been able to laugh and see the funny side of things:         Answer 1: As much as I always could (Selected Answer)         Answer 2: Not quite so much now         Answer 3: Definitely not so much now         Answer 4: Not at all  Question 2: I have looked forward with enjoyment to things:         Answer 1: As much as I ever did (Selected Answer)         Answer 2: Rather less than I used to         Answer 3: Definitely less than I used to         Answer 4: Hardly at all  Question 3: I have blamed myself unnecessarily when things went wrong:         Answer 1: Yes, most of the time         Answer 2: Yes, some of the time         Answer  3: Not very often (Selected Answer)         Answer 4: No, never  Question 4: I have been anxious or worried for no good reason:         Answer 1: No, not at all         Answer 2: Hardly ever         Answer 3: Yes, sometimes (Selected Answer)         Answer 4: Yes, very often  Question 5: I have felt scared or panicky for no very good reason:         Answer 1:  Yes, quite a lot         Answer 2: Yes, sometimes         Answer 3: No, not much (Selected Answer)         Answer 4: No, not at all  Question 6: Things have been getting on top of me:         Answer 1: Most of the time, I haven't coped at all.         Answer 2: Yes, sometimes I haven't coped as well         Answer 3: No, most of the time I have coped quite well.         Answer 4: No, I have been coping as well as ever. (Selected Answer)  Question 7: I have been so unhappy that I have had difficulty sleeping:         Answer 1: Yes, most of the time         Answer 2: Yes, sometimes         Answer 3: Not very often         Answer 4: No, not at all (Selected Answer)  Question 8: I have felt sad or miserable:         Answer 1: Yes, most of the time         Answer 2: Yes, quite often         Answer 3: Not very often         Answer 4: No, not at all (Selected Answer)  Question 9: I have been so unhappy that I have been crying:         Answer 1: Yes, most of the time         Answer 2: Yes, quite often         Answer 3: Only occasionally (Selected Answer)         Answer 4: No, never  Question 10: The thought of harming myself has occurred to me:         Answer 1: Yes, quite often         Answer 2: Sometimes         Answer 3: Hardly ever         Answer 4: Never (Selected Answer)  ----------DocumentID: [id number] (AP)-------------------------------------------              [facility]                       Patient Education Report         Name: ETOLA, MULL                  Date: 04/16/2024    MRN: [id number]                    Time: 1:08:13 AM          Patient ordered video: 'Baby Discharge at the [facility]'    from [id number] via phone number: [phone] at 1:08:13 AM    Description: This video prepares you for leaving the hospital and caring for your baby safely at home.    ----------DocumentID: [id number] (AP)-------------------------------------------              [facility]                       Patient Education Report  Name: GRAZIELLA, CONNERY                  Date: 04/17/2024    MRN: [id number]                    Time: 6:27:20 AM         Patient ordered video: 'Mom Discharge at the [facility]'    from [id number]_[id number] via phone number: [phone] at 6:27:20 AM    Description: This video prepares you for leaving the hospital and how to care for yourself at home.    ----------DocumentID: [id number] (AP)-------------------------------------------              [facility]                       Patient Education Report         Name: BAYLEY, YARBOROUGH                  Date: 04/17/2024    MRN: [id number]                    Time: 6:32:13 AM         Patient ordered survey: Discharge Questionnaire (New Mom) 2025    from [phone] via phone number: [phone] at 6:32:13 AM    <p>Your care team will let you know when your discharge plan is almost complete. Then, we will ask you to answer a few questions to make sure you are prepared to go home. When your nurse advises, choose the answers that best describe how you feel today.</p>    Question 1: I am a first-time mom.         Answer 1: True (Selected Answer)         Answer 2: False  Question 2: How do you feel about going home?         Answer 1: I don't feel ready at all         Answer 2: I feel a little ready         Answer 3: I feel mostly ready (Selected Answer)         Answer 4: I feel completely ready  Question 3: Did the videos and teaching help you feel more confident about caring for your baby at home?         Answer 1: Not at all         Answer 2: Somewhat         Answer 3: Mostly          Answer 4: Completely (Selected Answer)  Question 4: What body temperature is a sign you may have an infection?         Answer 1: 99 degrees         Answer 2: 100.4 degrees (Selected Answer)         Answer 3: 104 degrees         Answer 4: I'm unsure  Question 5: If you have chest pain, blurry vision, swelling in your hands, feet, or face, or trouble breathing, you should call your doctor or go to the emergency room. This could mean preeclampsia.         Answer 1: True (Selected Answer)         Answer 2: False         Answer 3: I'm unsure  Question 6: Babies are safest sleeping alone on their back,  in their cribs with no blankets, bumpers, pillows or stuffed animals.         Answer 1: True (Selected Answer)         Answer 2: False         Answer 3: I'm unsure  Question 7: When should I call for help?         Answer 1: Rectal temp of 100.4         Answer 2: Unusual or excessive crying         Answer 3: Umbilical cord redness or drainage         Answer 4: Vomiting         Answer 5: All of the above (Selected Answer)  Question 8: An infant car seat base should be tight to the seat, straps are snug with no extra space and is rear facing.         Answer 1: True (Selected Answer)         Answer 2: False         Answer 3: I'm unsure  Question 9: You must make your postpartum follow up appointment BEFORE you leave the hospital.         Answer 1: True (Selected Answer)         Answer 2: False         Answer 3: I'm unsure  ----------DocumentID: [id number] ALAN)-------------------------------------------    [facility]           Patient Education Report - Discharge Summary         Name: KORENE DULA                  Discharge Date: 04/17/2024    MRN: [id number]                    Discharge Time: 03:16:40 PM    Patient ordered Safe Sleep Practices    from [id number]_1 on 04/14/2024 at 06:32:24 PM    Synopsis Description: Safe Sleep Practices    Patient ordered Helpful Strategies for Successful  Breastfeeding - CC    from [id number]_5883_[id number] on 04/14/2024 at 11:18:29 PM    Synopsis Description: Watch some common, effective strategies that may help make breastfeeding easier.    Patient ordered Common Breastfeeding Positions - CC    from [id number]_5883_[id number] on 04/14/2024 at 11:24:15 PM    Synopsis Description: Watch demonstrations of the three most common ways to hold your breastfeeding baby.    ----------DocumentID: [id number] (AP)-------------------------------------------                       [facility]          Patient Education Report - Discharge Summary        Date: 04/17/2024   Time: 3:08:16 PM   Name: JAILEE, JAQUEZ   MRN: [id number]      Account Number: [id number]      Education History:        Patient ordered video: 'Patient Safety: Stay Safe While you are in the Hospital' from [id number]_[email or web address]_1 on 04/16/2024 12:49:50 AM    Patient ordered video: 'Safe Sleep Practices' from [id number]_[email or web address]_[id number] on 04/16/2024 12:49:56 AM    Patient ordered survey: '[place] Scale (EPDS)' from [id number] on 04/16/2024 12:50:36 AM    Patient ordered video: 'Baby Discharge at the [facility]' from [id number]_5883_[id number] on 04/16/2024 01:08:13 AM    Patient ordered video: 'Mom Discharge at the [facility]' from [id number]_5883_[id number] on 04/17/2024  06:27:20 AM    Patient ordered survey: 'Discharge Questionnaire (New Mom) 2025' from [id number]_[id number] on 04/17/2024 06:32:13 AM

## 2024-04-17 NOTE — Progress Notes (Signed)
 Progress Note    Patient: Gina Becker MRN: [id number]  SSN: [id number]    Date of Birth: August 10, 1990  Age: 33 y.o.  Sex: female      Subjective:     Postpartum Day: 3     Delivery: cesarean delivery  Pt denies lightheadedness, dizziness, SOB, RUQ pain, headache, visual changes, Does admit to some pain in her chest/breast where milk is coming in. Prenatal course noted for LGA, polyhydramnios, + GBS, elevated HgbA1C in early pregnancy - early glucola WNL.     Patient feels well. Pain is well controlled with current medications. The patient is ambulating well. Flatus has been passed.  The baby is female.     Objective:      Patient Vitals for the past 24 hrs:   BP Temp Temp src Pulse Resp SpO2   04/17/24 0528 112/69 98.1 F (36.7 C) Oral 89 18 98 %   04/16/24 2141 112/68 97.7 F (36.5 C) Oral 89 18 98 %   04/16/24 1450 121/74 97.9 F (36.6 C) Oral 98 17 99 %     Temp (24hrs), Avg:97.9 F (36.6 C), Min:97.7 F (36.5 C), Max:98.1 F (36.7 C)      General: alert, appears stated age, and cooperative   Uterine Fundus:   firm nontender   Incision:  C/d/I per nursing exam    Breasts slightly engorged and tender without signs of mastitis     Lab/Data Review:  Recent Results (from the past 72 hours)   CBC    Collection Time: 04/15/24  6:14 AM   Result Value Ref Range    WBC 17.1 (H) 4.0 - 11.0 1000/mm3    RBC 3.46 (L) 3.60 - 5.20 M/uL    Hemoglobin 9.4 (L) 11.0 - 16.0 gm/dl    Hematocrit 70.6 (L) 35.0 - 47.0 %    MCV 84.7 80.0 - 98.0 fL    MCH 27.2 25.4 - 34.6 pg    MCHC 32.1 30.0 - 36.0 gm/dl    Platelets 825 859 - 450 1000/mm3    MPV 12.9 (H) 6.0 - 10.0 fL    RDW 49.4 (H) 36.4 - 46.3     CBC W/O DIFF    Collection Time: 04/16/24  7:28 AM   Result Value Ref Range    WBC 12.2 (H) 4.0 - 11.0 1000/mm3    RBC 3.25 (L) 3.60 - 5.20 M/uL    Hemoglobin 8.8 (L) 11.0 - 16.0 gm/dl    Hematocrit 72.4 (L) 35.0 - 47.0 %    MCV 84.6 80.0 - 98.0 fL    MCH 27.1 25.4 - 34.6 pg    MCHC 32.0 30.0 - 36.0 gm/dl    Platelets 829 859 - 450 1000/mm3     MPV 12.7 (H) 6.0 - 10.0 fL    RDW 51.9 (H) 36.4 - 46.3             Assessment:     Status post: post partum day 3 s/p c-section  Asymptomatic anemia     Plan:   dc home, follow up 2 weeks or sooner if needed, Call for severe headache, blurred vision or pain in your upper abdomen, nothing in your vagina until after your 6 week follow up visit, and Call the office if you want an IUD or Nexplanon ordered      .    Signed By: Delaine SHAUNNA Deem, APRN - NP     April 17, 2024

## 2024-04-17 NOTE — Discharge Instructions (Signed)
 Discharge Instructions  Right after birth and for the first few days, your vaginal flow will be bright red, similar to your menstrual flow.  After the first few days, your discharge will gradually change to a pale pink or brownish color.  By the end of the second week, you may have little more than a clear discharge.  Vaginal discharge can last up to six weeks.  It is important to change your pad on a regular basis.    Activity:  No lifting anything heavier than your infant for the first two to four weeks.      Rest frequently.  No vigorous exercise until approved by your [facility] doctor.     Gradually increase your daily activities until you are back to your normal routine.    Hygiene:  Shower only.  No baths for six weeks.       You may use soap on your incision and perineum.  Perineal sutures will       dissolve on their own.         If you had a C-Section, you may have staples that your doctor will remove in      the office.  Steri strips will fall off in about 7 - 10 days.      You should check your incision daily for signs of infection, including redness,      Drainage or swelling.  Always wipe from front to back after going to the toilet       and clean your perineum with warm tap water using the peri-bottle every time      you use the bathroom.  No tampons, douching, sexual intercourse - nothing in       your vagina - for the first six weeks after discharge.    Breast Care:  Wear a well fitting, supportive bra without underwire may be more              comfortable as your body adjusts to breast changes.             Wear breast pads - cotton is best - until your breasts stop leaking.  Change              the pads frequently.              If bottle feeding and your breasts begin filling, use cabbage leaves.               Instructions for use of cabbage are included in your                Admission/Discharge/Home folder.  You may use ice packs made of             Crushed ice in plastic bags that will mold to your  breasts.      Diet:    Drink plenty of juices and water.  Eat foods high in fiber such as bran, broccili,   cauliflower and fruits to help keep bowel movements regular.   Eat well balanced meals that include all five food groups:  Protein, vegetables,   fruits, grains and dairy.    Hormonal Changes:  Emotional changes and mood swings are not uncommon after               giving birth.  If this goes on for more than a two week period, it may              be  post partum depression.  Symptoms can include trouble              Sleeping, sleeping too much, feelings of irritability, anxiety, panic,               Hopelessness or guilt, lack of energy or motivation, poor                         concentration, and/or rapid mood swings.    Symptoms to Report to your Doctor:  Heavy Bleeding: blood is bright red and soaks a       sanitary pad in one hour or less.       Foul smelling vaginal discharge.       Difficult, painful or too-frequent urination.       Pain that becomes worse.       Mood swings or crying that feels out of control       Flu like symptoms       Temperature greater than 100.4       Incision is red, swollen or has any drainage       Trouble breathing, dizziness or faintness       Warmth, pain or redness in the lower leg                                 Cesarean Section: What to Expect at Home  Your Recovery    A cesarean section, or C-section, is surgery to deliver your baby through a cut that the doctor makes in your lower belly and uterus. The cut is called an incision.  You may have some pain in your lower belly and need pain medicine for 1 to 2 weeks. You can expect some vaginal bleeding for several weeks. You will probably need about 6 weeks to fully recover.  It's important to take it easy while the incision heals. Avoid heavy lifting, strenuous activities, and exercises that strain the belly muscles while you recover. Ask a family member or friend for help with chores at home.  This care sheet gives you a  general idea about how long it will take for you to recover. But each person recovers at a different pace. Follow the steps below to get better as quickly as possible.  How can you care for yourself at home?  Taking care of your body  Use pads instead of tampons for bleeding. After birth, you will have bloody vaginal discharge. You may also pass some blood clots that shouldn't be bigger than an egg. Over the next 6 weeks or so, your bleeding should decrease a little every day and slowly change to a pinkish and then whitish discharge.  For cramps or mild pain, try an over-the-counter pain medicine, such as acetaminophen  (Tylenol ) or ibuprofen  (Advil , Motrin ). Read and follow all instructions on the label.  If your doctor or midwife gave you a prescription medicine for pain, take it as prescribed.  To ease pain around the vagina or from hemorrhoids:  Put ice or a cold pack on the area for 10 to 20 minutes at a time. Put a thin cloth between the ice and your skin.  Try sitting in a few inches of warm water (sitz bath) when you can or after bowel movements.  Clean yourself with a gentle squeeze of warm water from a bottle instead of wiping with toilet paper.  Use  witch hazel or hemorrhoid pads (such as Tucks).  Ease constipation by drinking plenty of fluids and eating high-fiber foods. Ask your doctor or midwife about over-the-counter stool softeners.  Try using a cold compress for sore and swollen breasts. And wear a supportive bra that fits.  Activity  Rest when you feel tired. Getting enough sleep will help you recover. Ask for help from family or friends when you need it.  When you feel ready, try to get some exercise every day. For many people, walking is a good choice.  Avoid lifting anything heavy or doing strenuous activities, such as bicycle riding, jogging, weightlifting, and aerobic exercise, for 6 weeks or until your doctor or midwife says it is okay.  Do not do sit-ups or other exercises that strain the  belly muscles for 6 weeks or until your doctor or midwife says it is okay.  Hold a pillow over your incision when you cough or take deep breaths. This will support your belly and decrease your pain.  Ask your doctor or midwife when you can drive again.  You will probably need to take at least 6 weeks off work. It depends on the type of work you do and how you feel.  Ask your doctor or midwife when it is okay to have sex.  If you don't want to get pregnant, talk to your doctor or midwife about birth control options. You can get pregnant even before your period returns. You can also get pregnant while you are breastfeeding.  Talk to your doctor or midwife if you want to get pregnant again. They can talk to you about when it is safe.  Incision care  If you have strips of tape on the incision, leave the tape on until it falls off. If you have stitches, they will dissolve on their own and don't need to be removed.  Wash the area daily with warm, soapy water, and pat it dry. Don't use hydrogen peroxide or alcohol, which can slow healing. You may cover the area with a gauze bandage if it oozes or rubs against clothing. Change the bandage every day. Keep the area clean and dry.  You may shower as usual. Pat the incision dry when you are done.  Taking care of your emotional health  It's normal to have some sadness, anxiety, and mood swings after delivery. You can always call the Maternal Mental Health Hotline at 1-833-TLC-MAMA ((605) 029-9208) for support. If these mood changes last more than a couple of weeks, talk to your doctor or midwife.  Follow-up care is a key part of your treatment and safety. Be sure to make and go to all appointments, and call your doctor if you are having problems. It's also a good idea to know your test results and keep a list of the medicines you take.  When should you call for help?  Share this information with your partner, family, or a friend. They can help you watch for warning signs.  Call  911 anytime you think you may need emergency care. For example, call if:  You feel you cannot stop from hurting yourself, your baby, or someone else.  You passed out (lost consciousness).  You have chest pain, are short of breath, or cough up blood.  You have a seizure.  You have severe vaginal bleeding. You have soaked through one or more pads in an hour, and the bleeding is not slowing down.  Where to get help 24 hours a day, 7 days  a week  If you or someone you know talks about suicide, self-harm, a mental health crisis, a substance use crisis, or any other kind of emotional distress, get help right away. You can:  Call the Suicide and Crisis Lifeline at 82.  Text HOME to 704-466-3430 to access the Crisis Text Line.  Consider saving these numbers in your phone.  Go to 988lifeline.org for more information or to chat online.  Contact your doctor or midwife now or seek immediate medical care if:  You have loose stitches, or your incision comes open.  You have signs of hemorrhage (too much bleeding), such as:  Heavy vaginal bleeding. This means that you are soaking through one or more pads in an hour. Or you pass blood clots bigger than an egg.  Feeling dizzy or lightheaded, or you feel like you may faint.  Feeling so tired or weak that you cannot do your usual activities.  A fast or irregular heartbeat.  New or worse belly pain.  You have symptoms of infection, such as:  Increased pain, swelling, warmth, or redness.  Red streaks leading from the incision.  Pus draining from the incision.  A fever.  Frequent or painful urination or blood in your urine.  Vaginal discharge that smells bad.  New or worse belly pain.  You have symptoms of a blood clot in your leg (called a deep vein thrombosis), such as:  Pain in the calf, back of the knee, thigh, or groin.  Swelling in the leg or groin.  A color change on the leg or groin. The skin may be reddish or purplish.  You have signs of preeclampsia, such as:  Sudden swelling of your  face, hands, or feet.  New vision problems (such as dimness, blurring, or seeing spots).  A severe headache.  You have signs of heart failure, such as:  New or increased shortness of breath.  New or worse swelling in your legs, ankles, or feet.  Sudden weight gain, such as more than 2 to 3 pounds in a day or 5 pounds in a week.  Feeling so tired or weak that you cannot do your usual activities.  You had spinal or epidural pain relief and have:  New or worse back pain.  Increased pain, swelling, warmth, or redness at the injection site.  Tingling, weakness, or numbness in your legs or groin.  Watch closely for changes in your health, and be sure to contact your doctor or midwife if:  Your vaginal bleeding isn't decreasing.  You feel sad, anxious, or hopeless for more than a few days.  You are having problems with your breasts or breastfeeding.  Where can you learn more?  Go to [employer] and enter M806 to learn more about Cesarean Section: What to Expect at Home.  Current as of: February 18, 2024  Content Version: 14.6   2024-2025 [place], [place].   Care instructions adapted under license by [facility]. If you have questions about a medical condition or this instruction, always ask your healthcare professional. Romayne Alderman, [facility], disclaims any warranty or liability for your use of this information.       Breastfeeding: Care Instructions  Breastfeeding (sometimes called chestfeeding) is a skill that gets easier with practice. Try to be patient with yourself and your baby. You're both learning how to breastfeed.    Breastfeeding has many benefits. It can help you bond with your baby. It may lower your baby's chances of getting an infection.  If you have trouble breastfeeding, talk to your doctor, midwife, or Advertising copywriter. Support can also come from a trusted friend or family member who knows how to breastfeed.         Eat a variety of foods.   Choose vegetables, fruits,  milk products, whole grains, and proteins.        Try to limit or avoid certain things.   Limit alcohol. It can pass through breast milk to your baby.  Avoid smoking, vaping, marijuana, and other drugs.  Try to reduce caffeine if your baby is fussy and has problems with sleep.  Avoid fish high in mercury. These include shark, swordfish, king mackerel, marlin, orange roughy, and bigeye tuna, as well as tilefish from the [place].        Talk to your doctor about medicines and supplements.   Some can affect your breast milk or your baby.        Try different breastfeeding positions.   Find what works for you and your baby.  Different holds include cradle, cross-cradle, football, [place], laid back, and side-lying.        Take care of yourself.   Take steps to prevent painful or cracked nipples.  Make sure your baby feeds with a good latch.  Ask for help if you're having pain while breastfeeding.  Try letting some breast milk dry on your nipples.  Rest when you can, and drink plenty of water. And ask for help if you need it.  When should you call for help?   Call your doctor now or seek immediate medical care if:    You have symptoms of a breast infection, such as:  Increased pain, swelling, redness, or warmth around a breast.  Red streaks extending from the breast.  Pus draining from a breast.  A fever.     Your baby has no wet diapers for 6 hours.   Watch closely for changes in your health, and be sure to contact your doctor if:    Your baby has trouble latching on to your breast.     You continue to have pain or discomfort when breastfeeding.     You have other questions or concerns.   Where can you learn more?  Go to [employer] and enter P492 to learn more about Breastfeeding: Care Instructions.  Current as of: February 18, 2024  Content Version: 14.6   2024-2025 [place], [place].   Care instructions adapted under license by [facility]. If you have questions about a medical  condition or this instruction, always ask your healthcare professional. Romayne Alderman, [facility], disclaims any warranty or liability for your use of this information.       Depression After Childbirth: Care Instructions  It's common to lose sleep, feel irritable, and cry easily during the first few days after childbirth. Hormone changes and the demands of a new baby can cause these baby blues. If these mood changes last more than 2 weeks, you may have postpartum depression. This is a medical condition that requires treatment.  If you have any of these signs, you may be depressed. See your doctor right away.    You feel very sad or hopeless and lose interest in daily activities.       You sleep too much or not enough.       You feel tired or as if you have no energy.       You eat too much or too little.  You write or talk about death.     Tips to help with postpartum depression        What you can do   Try to go to all of your counseling sessions.  Take medicines as directed.  Eat healthy foods.  Get daily exercise, such as walks.  Try to get some sunlight every day.  Avoid using alcohol or other substances.  Get as much rest as possible.  Connect with friends, and join a support group for new parents.  When should you call for help?   Call 911 if:    You feel you cannot stop from hurting yourself, your baby, or someone else.   Where to get help 24 hours a day, 7 days a week   If you or someone you know talks about suicide, self-harm, a mental health crisis, a substance use crisis, or any other kind of emotional distress, get help right away. You can:    Call the Suicide and Crisis Lifeline at 29.     Call 1-800-273-TALK ((317) 653-5614).     Text HOME to (204) 829-9533 to access the Crisis Text Line.   Consider saving these numbers in your phone.  Go to 988lifeline.org for more information or to chat online.  Call your doctor now or seek immediate medical care if:    You are having trouble caring for yourself or  your baby.     You hear voices.   Contact your doctor if:    You have problems with your medicines.     You do not get better as expected.   Follow-up care is a key part of your treatment and safety. Be sure to make and go to all appointments, and call your doctor if you are having problems. It's also a good idea to know your test results and keep a list of the medicines you take.  Where can you learn more?  Go to [employer] and enter Y765 to learn more about Depression After Childbirth: Care Instructions.  Current as of: March 06, 2023  Content Version: 14.6   2024-2025 [place], [place].   Care instructions adapted under license by [facility] If you have questions about a medical condition or this instruction, always ask your healthcare professional. Romayne Alderman, [facility], disclaims any warranty or liability for your use of this information.       Learning About Preeclampsia After Childbirth  What is preeclampsia?     Preeclampsia is high blood pressure and signs of organ damage, such as protein in the urine, usually after 20 weeks of pregnancy. If it's not treated, preeclampsia can harm you or your baby.  Severe preeclampsia can lead to dangerous seizures (eclampsia). When preeclampsia affects the liver, it can cause HELLP syndrome, a blood-clotting and bleeding problem. HELLP can come on quickly and can be dangerous. This is why your doctor checks you and your baby often.  Preeclampsia usually goes away after the baby is born. But symptoms may last or get worse after delivery. In rare cases, symptoms may not show up until days or even weeks after childbirth.  What are the symptoms?  Mild preeclampsia usually doesn't cause symptoms. But it may cause rapid weight gain and sudden swelling of the hands and face. Severe preeclampsia can cause symptoms such as a severe headache, vision problems, and trouble breathing. It also can cause belly pain. And you may urinate less than  usual.  What can you expect after you've had preeclampsia?  In the  hospital  After the baby and the placenta are delivered, preeclampsia usually starts to get better. Most people get better in the first few days after childbirth.  After having preeclampsia, you still have a risk of seizures for a day or more after childbirth. (In very rare cases, seizures happen later on.) So your doctor may have you take magnesium  sulfate for a day or more to prevent seizures. You may also take medicine to lower your blood pressure.  When you go home  Your blood pressure will most likely return to normal a few days after delivery. Your doctor will want to check your blood pressure sometime in the first week after you leave the hospital.  High blood pressure sometimes continues after childbirth. But it usually returns to normal levels with time.  Take and record your blood pressure at home if your doctor tells you to.  Ask your doctor to check your blood pressure monitor to be sure that it is accurate and that the cuff fits you. Also ask your doctor to watch you use it, to make sure that you are using it right.  Don't eat, use tobacco products, or use medicine known to raise blood pressure (such as some nasal decongestant sprays) before you take your blood pressure.  Avoid taking your blood pressure if you have just exercised or if you're nervous or upset. Rest at least 15 minutes before you take your blood pressure.  Take your medicines exactly as prescribed. Call your doctor if you think you are having a problem with your medicine.  If you smoke, try to quit. Talk to your doctor if you need help quitting.  Eat a variety of healthy foods. Include plenty of foods high in calcium, such as dairy products, almonds, and dark leafy greens.  Long-term health  After you've had preeclampsia, you have an increased risk of high blood pressure, heart disease, stroke, and kidney disease. This may be because the same things that cause  preeclampsia also cause heart and kidney disease.  To protect your health, work with your doctor on living a heart-healthy lifestyle and getting the checkups you need. Your doctor may also want you to check your blood pressure at home.  Follow-up care is a key part of your treatment and safety. Be sure to make and go to all appointments, and call your doctor if you are having problems. It's also a good idea to know your test results and keep a list of the medicines you take.  When should you call for help?  Share this information with your partner or a friend. They can help you watch for warning signs.  Call 911  anytime you think you may need emergency care. For example, call if:    You passed out (lost consciousness).     You have a seizure.     You have trouble breathing.     You have chest pain.   Call your doctor now or seek immediate medical care if:    You have symptoms of preeclampsia, such as:  Sudden swelling of your face, hands, or feet.  New vision problems (such as dimness, blurring, or seeing spots).  A severe headache.     Your blood pressure is very high, such as 160/110 or higher.     Your blood pressure is higher than your doctor told you it should be, or it rises quickly.     You have new nausea or vomiting.     You have pain  in your belly or pelvis.     You gain weight rapidly.   Where can you learn more?  Go to [employer] and enter Q718 to learn more about Learning About Preeclampsia After Childbirth.  Current as of: February 18, 2024  Content Version: 14.6   2024-2025 [place], [place].   Care instructions adapted under license by [facility]. If you have questions about a medical condition or this instruction, always ask your healthcare professional. Romayne Alderman, [facility], disclaims any warranty or liability for your use of this information.

## 2024-04-17 NOTE — Progress Notes (Signed)
 Discharge teaching reviewed. All questions answered. Prescriptions sent per MD to pharmacy.
